# Patient Record
Sex: Female | Born: 1995 | Hispanic: No | Marital: Single | Smoking: Never smoker
Health system: Southern US, Community
[De-identification: ages and names within clinical notes are randomized; demographics above are authoritative.]

---

## 2020-04-11 ENCOUNTER — Inpatient Hospital Stay (HOSPITAL_BASED_OUTPATIENT_CLINIC_OR_DEPARTMENT_OTHER)
Admission: EM | Admit: 2020-04-11 | Discharge: 2020-04-16 | DRG: 177 | Disposition: A | Discharge status: Home / Self Care | Payer: Medicaid - Out of State | Attending: Internal Medicine | Admitting: Internal Medicine

## 2020-04-11 ENCOUNTER — Encounter (HOSPITAL_BASED_OUTPATIENT_CLINIC_OR_DEPARTMENT_OTHER): Payer: Self-pay | Admitting: Emergency Medicine

## 2020-04-11 ENCOUNTER — Emergency Department (HOSPITAL_BASED_OUTPATIENT_CLINIC_OR_DEPARTMENT_OTHER): Payer: Medicaid - Out of State

## 2020-04-11 ENCOUNTER — Other Ambulatory Visit: Payer: Self-pay

## 2020-04-11 DIAGNOSIS — T380X5A Adverse effect of glucocorticoids and synthetic analogues, initial encounter: Secondary | ICD-10-CM | POA: Diagnosis not present

## 2020-04-11 DIAGNOSIS — Z6841 Body Mass Index (BMI) 40.0 and over, adult: Secondary | ICD-10-CM

## 2020-04-11 DIAGNOSIS — F419 Anxiety disorder, unspecified: Secondary | ICD-10-CM | POA: Diagnosis present

## 2020-04-11 DIAGNOSIS — Z791 Long term (current) use of non-steroidal anti-inflammatories (NSAID): Secondary | ICD-10-CM

## 2020-04-11 DIAGNOSIS — R739 Hyperglycemia, unspecified: Secondary | ICD-10-CM | POA: Diagnosis not present

## 2020-04-11 DIAGNOSIS — J9601 Acute respiratory failure with hypoxia: Secondary | ICD-10-CM | POA: Diagnosis present

## 2020-04-11 DIAGNOSIS — Z79899 Other long term (current) drug therapy: Secondary | ICD-10-CM

## 2020-04-11 DIAGNOSIS — U071 COVID-19: Principal | ICD-10-CM | POA: Diagnosis present

## 2020-04-11 DIAGNOSIS — J1282 Pneumonia due to coronavirus disease 2019: Secondary | ICD-10-CM | POA: Diagnosis present

## 2020-04-11 LAB — CBC WITH DIFFERENTIAL/PLATELET
Abs Immature Granulocytes: 0.06 10*3/uL (ref 0.00–0.07)
Basophils Absolute: 0 10*3/uL (ref 0.0–0.1)
Basophils Relative: 0 %
Eosinophils Absolute: 0 10*3/uL (ref 0.0–0.5)
Eosinophils Relative: 0 %
HCT: 40.8 % (ref 36.0–46.0)
Hemoglobin: 13.8 g/dL (ref 12.0–15.0)
Immature Granulocytes: 1 %
Lymphocytes Relative: 13 %
Lymphs Abs: 0.8 10*3/uL (ref 0.7–4.0)
MCH: 29.4 pg (ref 26.0–34.0)
MCHC: 33.8 g/dL (ref 30.0–36.0)
MCV: 86.8 fL (ref 80.0–100.0)
Monocytes Absolute: 0.2 10*3/uL (ref 0.1–1.0)
Monocytes Relative: 2 %
Neutro Abs: 5.4 10*3/uL (ref 1.7–7.7)
Neutrophils Relative %: 84 %
Platelets: 163 10*3/uL (ref 150–400)
RBC: 4.7 MIL/uL (ref 3.87–5.11)
RDW: 11.7 % (ref 11.5–15.5)
WBC: 6.5 10*3/uL (ref 4.0–10.5)
nRBC: 0 % (ref 0.0–0.2)

## 2020-04-11 LAB — COMPREHENSIVE METABOLIC PANEL
ALT: 30 U/L (ref 0–44)
AST: 32 U/L (ref 15–41)
Albumin: 3.5 g/dL (ref 3.5–5.0)
Alkaline Phosphatase: 72 U/L (ref 38–126)
Anion gap: 10 (ref 5–15)
BUN: 7 mg/dL (ref 6–20)
CO2: 24 mmol/L (ref 22–32)
Calcium: 8 mg/dL — ABNORMAL LOW (ref 8.9–10.3)
Chloride: 103 mmol/L (ref 98–111)
Creatinine, Ser: 0.8 mg/dL (ref 0.44–1.00)
GFR calc Af Amer: >60 mL/min (ref >60)
GFR calc non Af Amer: >60 mL/min (ref >60)
Glucose, Bld: 136 mg/dL — ABNORMAL HIGH (ref 70–99)
Potassium: 3.7 mmol/L (ref 3.5–5.1)
Sodium: 137 mmol/L (ref 135–145)
Total Bilirubin: 0.5 mg/dL (ref 0.3–1.2)
Total Protein: 7.1 g/dL (ref 6.5–8.1)

## 2020-04-11 LAB — TRIGLYCERIDES: Triglycerides: 107 mg/dL (ref <150)

## 2020-04-11 LAB — SARS CORONAVIRUS 2 BY RT PCR (HOSPITAL ORDER, PERFORMED IN ~~LOC~~ HOSPITAL LAB): SARS Coronavirus 2: POSITIVE — AB

## 2020-04-11 LAB — FIBRINOGEN: Fibrinogen: 443 mg/dL (ref 210–475)

## 2020-04-11 LAB — C-REACTIVE PROTEIN: CRP: 13.3 mg/dL — ABNORMAL HIGH (ref <1.0)

## 2020-04-11 LAB — LACTIC ACID, PLASMA: Lactic Acid, Venous: 1 mmol/L (ref 0.5–1.9)

## 2020-04-11 LAB — LACTATE DEHYDROGENASE: LDH: 374 U/L — ABNORMAL HIGH (ref 98–192)

## 2020-04-11 LAB — PROCALCITONIN: Procalcitonin: <0.1 ng/mL

## 2020-04-11 LAB — PREGNANCY, URINE: Preg Test, Ur: NEGATIVE

## 2020-04-11 LAB — FERRITIN: Ferritin: 318 ng/mL — ABNORMAL HIGH (ref 11–307)

## 2020-04-11 LAB — D-DIMER, QUANTITATIVE: D-Dimer, Quant: 0.68 ug/mL-FEU — ABNORMAL HIGH (ref 0.00–0.50)

## 2020-04-11 MED ORDER — METHYLPREDNISOLONE SODIUM SUCC 125 MG IJ SOLR
0.5000 mg/kg | Freq: Two times a day (BID) | INTRAMUSCULAR | Status: DC
  Administered 2020-04-11 – 2020-04-12 (×4): 53.125 mg via INTRAVENOUS
  Filled 2020-04-11 (×4): qty 2

## 2020-04-11 MED ORDER — SODIUM CHLORIDE 0.9 % IV SOLN
INTRAVENOUS | Status: DC | PRN
  Administered 2020-04-11: 500 mL via INTRAVENOUS

## 2020-04-11 MED ORDER — IOHEXOL 350 MG/ML SOLN
100.0000 mL | Freq: Once | INTRAVENOUS | Status: AC | PRN
  Administered 2020-04-11: 100 mL via INTRAVENOUS

## 2020-04-11 MED ORDER — SODIUM CHLORIDE 0.9 % IV SOLN
100.0000 mg | INTRAVENOUS | Status: AC
  Administered 2020-04-11 (×2): 100 mg via INTRAVENOUS
  Filled 2020-04-11 (×2): qty 20

## 2020-04-11 MED ORDER — KETOROLAC TROMETHAMINE 15 MG/ML IJ SOLN
15.0000 mg | Freq: Once | INTRAMUSCULAR | Status: AC
  Administered 2020-04-11: 15 mg via INTRAVENOUS
  Filled 2020-04-11: qty 1

## 2020-04-11 MED ORDER — DEXAMETHASONE SODIUM PHOSPHATE 10 MG/ML IJ SOLN: 6.0000 mg | Freq: Once | INTRAMUSCULAR | Status: DC

## 2020-04-11 MED ORDER — SODIUM CHLORIDE 0.9 % IV SOLN
100.0000 mg | Freq: Every day | INTRAVENOUS | Status: AC
  Administered 2020-04-12 – 2020-04-15 (×4): 100 mg via INTRAVENOUS
  Filled 2020-04-11 (×4): qty 20

## 2020-04-11 MED ORDER — IBUPROFEN 400 MG PO TABS
600.0000 mg | ORAL_TABLET | Freq: Once | ORAL | Status: AC
  Administered 2020-04-11: 600 mg via ORAL
  Filled 2020-04-11: qty 1

## 2020-04-11 NOTE — ED Triage Notes (Signed)
Dx with COVID 2 days ago. C/o worsening SOB O2 86% RA

## 2020-04-11 NOTE — ED Notes (Signed)
Repeat EKG per EDP Myrtis Ser

## 2020-04-11 NOTE — ED Notes (Signed)
Patient oxygen increased to 6L via [place]. Patient oxygen saturations 87-89% Patient on monitor and will continue to monitor and make changes as necessary. Patient tolerating well.

## 2020-04-11 NOTE — ED Provider Notes (Signed)
[facility] EMERGENCY DEPARTMENT Provider Note   CSN: [id number] Arrival date & time: 04/11/20  0300     History Chief Complaint  Patient presents with  . Shortness of Breath    COVID+    Kylie Petersen is a 24 y.o. female with no pertinent past medical history that presents to the emergency department today for shortness of breath secondary to Covid.  Patient states that she was diagnosed at [facility] 2 days ago, states that she started feeling worse for the past 2 days with shortness of breath, chest pain, myalgias, arthralgias, fatigue, fever, cough, abdominal pain, nausea, vomiting, diarrhea.  States that she finds it extremely difficult to breathe, especially upon exertion.  Denies any Covid contacts.  Has not gotten her Covid vaccines.  States that she was in  normal health before this.  States that she has not been eating and drinking normally due to no appetite.  States that she has been febrile at home, has been taking Tylenol and ibuprofen which has been providing some relief.  Has never been diagnosed with Covid before.  HPI     History reviewed. No pertinent past medical history.  Patient Active Problem List   Diagnosis Date Noted  . Acute hypoxemic respiratory failure due to COVID-19 [facility]) 04/11/2020    History reviewed. No pertinent surgical history.   OB History   No obstetric history on file.     No family history on file.  Social History   Tobacco Use  . Smoking status: Never Smoker  . Smokeless tobacco: Never Used  Substance Use Topics  . Alcohol use: Not on file  . Drug use: Not on file    Home Medications Prior to Admission medications   Not on File    Allergies    Patient has no known allergies.  Review of Systems   Review of Systems  Constitutional: Positive for diaphoresis, fatigue and fever. Negative for chills.  HENT: Negative for congestion, sore throat and trouble swallowing.   Eyes: Negative for pain and visual disturbance.   Respiratory: Positive for cough and shortness of breath. Negative for wheezing.   Cardiovascular: Negative for chest pain, palpitations and leg swelling.  Gastrointestinal: Positive for abdominal pain, diarrhea, nausea and vomiting. Negative for abdominal distention.  Genitourinary: Negative for difficulty urinating.  Musculoskeletal: Positive for arthralgias and myalgias. Negative for back pain, neck pain and neck stiffness.  Skin: Negative for pallor.  Neurological: Positive for headaches. Negative for dizziness, speech difficulty and weakness.  Psychiatric/Behavioral: Negative for confusion.    Physical Exam Updated Vital Signs BP 97/65 (BP Location: Left Arm)   Pulse 93   Temp 99.7 F (37.6 C) (Oral)   Resp 18   Ht 5\' 4"  (1.626 m)   Wt 106.6 kg   LMP 03/24/2020   SpO2 90%   BMI 40.34 kg/m   Physical Exam Constitutional:      General: She is in acute distress.     Appearance: Normal appearance. She is ill-appearing. She is not toxic-appearing or diaphoretic.     Comments: Patient is in respiratory distress, using accessory muscles.  Patient is able to speak to me in full sentences.  On 5 L and satting at 90%.  When patient starts to stand or walk she desats to 80%.  HENT:     Mouth/Throat:     Mouth: Mucous membranes are moist.     Pharynx: Oropharynx is clear.  Eyes:     General: No scleral icterus.  Extraocular Movements: Extraocular movements intact.     Pupils: Pupils are equal, round, and reactive to light.  Cardiovascular:     Rate and Rhythm: Regular rhythm. Tachycardia present.     Pulses: Normal pulses.     Heart sounds: Normal heart sounds.  Pulmonary:     Effort: Tachypnea, accessory muscle usage and respiratory distress present.     Breath sounds: No stridor. Rhonchi and rales present. No wheezing.  Chest:     Chest wall: No tenderness.  Abdominal:     General: Abdomen is flat. There is no distension.     Palpations: Abdomen is soft.     Tenderness:  There is no abdominal tenderness. There is no guarding or rebound.  Musculoskeletal:        General: No swelling or tenderness. Normal range of motion.     Cervical back: Normal range of motion and neck supple. No rigidity.     Right lower leg: No edema.     Left lower leg: No edema.  Skin:    General: Skin is warm and dry.     Capillary Refill: Capillary refill takes less than 2 seconds.     Coloration: Skin is not pale.  Neurological:     General: No focal deficit present.     Mental Status: She is alert and oriented to person, place, and time.     Cranial Nerves: No cranial nerve deficit.     Sensory: No sensory deficit.     Motor: No weakness.     Gait: Gait normal.  Psychiatric:        Mood and Affect: Mood normal.        Behavior: Behavior normal.     ED Results / Procedures / Treatments   Labs (all labs ordered are listed, but only abnormal results are displayed) Labs Reviewed  SARS CORONAVIRUS 2 BY RT PCR (HOSPITAL ORDER, PERFORMED IN [facility]) - Abnormal; Notable for the following components:      Result Value   SARS Coronavirus 2 POSITIVE (*)    All other components within normal limits  COMPREHENSIVE METABOLIC PANEL - Abnormal; Notable for the following components:   Glucose, Bld 136 (*)    Calcium 8.0 (*)    All other components within normal limits  D-DIMER, QUANTITATIVE (NOT [facility]) - Abnormal; Notable for the following components:   D-Dimer, Quant 0.68 (*)    All other components within normal limits  CULTURE, BLOOD (ROUTINE X 2)  CULTURE, BLOOD (ROUTINE X 2)  LACTIC ACID, PLASMA  CBC WITH DIFFERENTIAL/PLATELET  PREGNANCY, URINE  LACTIC ACID, PLASMA  PROCALCITONIN  LACTATE DEHYDROGENASE  FERRITIN  TRIGLYCERIDES  FIBRINOGEN  C-REACTIVE PROTEIN    EKG EKG Interpretation  Date/Time:  Saturday April 11 2020 10:37:55 EDT Ventricular Rate:  103 PR Interval:    QRS Duration: 87 QT Interval:  328 QTC Calculation: 430 R  Axis:   60 Text Interpretation: Sinus tachycardia Low voltage, precordial leads Confirmed by Virgina NorfolkAdam, [place] [phone]([id number]) on 04/11/2020 10:38:54 AM   Radiology DG Chest Port 1 View  Result Date: 04/11/2020 CLINICAL DATA:  COVID infection with increasing shortness of breath EXAM: PORTABLE CHEST 1 VIEW COMPARISON:  None FINDINGS: Trachea is midline. Cardiomediastinal contours partially obscured by bilateral basilar predominant airspace disease. Hilar structures also with partial obscuration. Lung volumes are low. RIGHT and LEFT hemidiaphragmatic contours are partially obscured more so on the RIGHT than the LEFT. Airspace pattern is basilar and peripheral predominant. No acute  skeletal process to the extent evaluated. IMPRESSION: 1. Bilateral basilar and predominant airspace disease in this patient with history of COVID infection. Pattern could be seen in the setting of COVID-19 infection. 2. Low volume chest. Electronically Signed   By: Donzetta Kohut M.D.   On: 04/11/2020 11:37    Procedures .Critical Care Performed by: Farrel Gordon, PA-C Authorized by: Farrel Gordon, PA-C   Critical care provider statement:    Critical care time (minutes):  25   Critical care was necessary to treat or prevent imminent or life-threatening deterioration of the following conditions:  Respiratory failure   Critical care was time spent personally by me on the following activities:  Discussions with consultants, evaluation of patient's response to treatment, examination of patient, ordering and performing treatments and interventions, ordering and review of laboratory studies, ordering and review of radiographic studies, pulse oximetry, re-evaluation of patient's condition, obtaining history from patient or surrogate and review of old charts Comments:     Covid   (including critical care time)  Medications Ordered in ED Medications  remdesivir 100 mg in sodium chloride 0.9 % 100 mL IVPB (100 mg Intravenous New  Bag/Given 04/11/20 1212)  remdesivir 100 mg in sodium chloride 0.9 % 100 mL IVPB (has no administration in time range)  methylPREDNISolone sodium succinate (SOLU-MEDROL) 125 mg/2 mL injection 53.125 mg (53.125 mg Intravenous Given 04/11/20 1213)  0.9 %  sodium chloride infusion (500 mLs Intravenous New Bag/Given 04/11/20 1211)    ED Course  I have reviewed the triage vital signs and the nursing notes.  Pertinent labs & imaging results that were available during my care of the patient were reviewed by me and considered in my medical decision making (see chart for details).    MDM Rules/Calculators/A&P                         Chantil Bari is a 24 y.o. female with no pertinent past medical history that presents to the emergency department today for shortness of breath secondary to Covid.  Patient is in respiratory distress, requiring 5 L of oxygen and satting at 90%.  When patient walks with 5 L of oxygen she desats to about 83%.  Is tachypneic and tachycardic.  Blood pressure is 105/61 currently.  Is able to talk to me in full sentences, handling secretions well.  Will need to be admitted due to respiratory distress.  Remdesivir and Solumedrol given at this time.   EKG interpreted by me with sinus tachycardia.  CBC without any leukocytopenia, CMP without any abnormalities.  Chest x-ray shows Covid pneumonia. No other labs back yet.  1200 spoke to Dr. Alanda Slim, hospitalist who accept the patient.  The patient appears reasonably stabilized for admission considering the current resources, flow, and capabilities available in the ED at this time, and I doubt any other [facility] requiring further screening and/or treatment in the ED prior to admission.   I discussed this case with my attending physician, Dr. Lockie Mola, who cosigned this note including patient's presenting symptoms, physical exam, and planned diagnostics and interventions. Attending physician stated agreement with plan or made changes to plan  which were implemented.   Attending physician assessed patient at bedside.  Final Clinical Impression(s) / ED Diagnoses Final diagnoses:  Acute respiratory failure with hypoxia (HCC)  COVID-19    Rx / DC Orders ED Discharge Orders    None       Farrel Gordon, PA-C 04/11/20 1300  Virgina Norfolk, DO 04/11/20 1408

## 2020-04-11 NOTE — ED Notes (Signed)
Pt c/o increases CP, will update EDP

## 2020-04-11 NOTE — ED Notes (Signed)
ED Provider at bedside. 

## 2020-04-11 NOTE — ED Provider Notes (Signed)
Medical screening examination/treatment/procedure(s) were conducted as a shared visit with non-physician practitioner(s) and myself.  I personally evaluated the patient during the encounter. Briefly, the patient is a 24 y.o. female with no significant medical history presents to the ED with shortness of breath.  Patient with Covid symptoms for the last 4 days.  Tested positive at outside hospital.  Arrives hypoxic.  Tachycardic.  Patient quickly desaturates to the 80s upon ambulation.  Was placed on 5 L of oxygen with improvement of oxygen.  Patient has no other significant medical history.  She is overweight however otherwise she is a healthy 24 year old.  We will get Covid admission labs including chest x-ray.  Will give IV Decadron and IV remdesivir and anticipate admission to medicine.  We will continue to monitor her respiratory process but she overall looks comfortable on oxygen.  This chart was dictated using voice recognition software.  Despite best efforts to proofread,  errors can occur which can change the documentation meaning.  Kylie Petersen was evaluated in Emergency Department on 04/11/2020 for the symptoms described in the history of present illness. She was evaluated in the context of the global COVID-19 pandemic, which necessitated consideration that the patient might be at risk for infection with the SARS-CoV-2 virus that causes COVID-19. Institutional protocols and algorithms that pertain to the evaluation of patients at risk for COVID-19 are in a state of rapid change based on information released by regulatory bodies including the CDC and federal and state organizations. These policies and algorithms were followed during the patient's care in the ED.    EKG Interpretation None           Virgina Norfolk, DO 04/11/20 1031

## 2020-04-11 NOTE — ED Notes (Signed)
Placed patient on HFNC @ 12L 100% oxygen . Patient oxygen saturations 87%, RR 35. Patient extremely anxious shaking legs. Will continue to monitor and make changes as necessary. Patient tolerating well.

## 2020-04-11 NOTE — ED Notes (Signed)
Pt eating

## 2020-04-11 NOTE — ED Notes (Signed)
Pt given ginger ale.

## 2020-04-12 DIAGNOSIS — R0602 Shortness of breath: Secondary | ICD-10-CM | POA: Diagnosis present

## 2020-04-12 DIAGNOSIS — R7989 Other specified abnormal findings of blood chemistry: Secondary | ICD-10-CM | POA: Diagnosis not present

## 2020-04-12 DIAGNOSIS — R000 Tachycardia, unspecified: Secondary | ICD-10-CM | POA: Diagnosis not present

## 2020-04-12 DIAGNOSIS — U071 COVID-19: Secondary | ICD-10-CM | POA: Diagnosis present

## 2020-04-12 DIAGNOSIS — Z6841 Body Mass Index (BMI) 40.0 and over, adult: Secondary | ICD-10-CM | POA: Diagnosis not present

## 2020-04-12 DIAGNOSIS — Z791 Long term (current) use of non-steroidal anti-inflammatories (NSAID): Secondary | ICD-10-CM | POA: Diagnosis not present

## 2020-04-12 DIAGNOSIS — Z79899 Other long term (current) drug therapy: Secondary | ICD-10-CM | POA: Diagnosis not present

## 2020-04-12 DIAGNOSIS — R739 Hyperglycemia, unspecified: Secondary | ICD-10-CM | POA: Diagnosis not present

## 2020-04-12 DIAGNOSIS — F419 Anxiety disorder, unspecified: Secondary | ICD-10-CM | POA: Diagnosis present

## 2020-04-12 DIAGNOSIS — J9601 Acute respiratory failure with hypoxia: Secondary | ICD-10-CM | POA: Diagnosis present

## 2020-04-12 DIAGNOSIS — J1282 Pneumonia due to coronavirus disease 2019: Secondary | ICD-10-CM | POA: Diagnosis present

## 2020-04-12 DIAGNOSIS — T380X5A Adverse effect of glucocorticoids and synthetic analogues, initial encounter: Secondary | ICD-10-CM | POA: Diagnosis not present

## 2020-04-12 LAB — TROPONIN I (HIGH SENSITIVITY)
Troponin I (High Sensitivity): 2 ng/L
Troponin I (High Sensitivity): 2 ng/L (ref <18)

## 2020-04-12 LAB — COMPREHENSIVE METABOLIC PANEL
ALT: 26 U/L (ref 0–44)
AST: 32 U/L (ref 15–41)
Albumin: 3.3 g/dL — ABNORMAL LOW (ref 3.5–5.0)
Alkaline Phosphatase: 64 U/L (ref 38–126)
Anion gap: 14 (ref 5–15)
BUN: 11 mg/dL (ref 6–20)
CO2: 25 mmol/L (ref 22–32)
Calcium: 8.6 mg/dL — ABNORMAL LOW (ref 8.9–10.3)
Chloride: 101 mmol/L (ref 98–111)
Creatinine, Ser: 0.85 mg/dL (ref 0.44–1.00)
GFR calc Af Amer: >60 mL/min (ref >60)
GFR calc non Af Amer: >60 mL/min (ref >60)
Glucose, Bld: 131 mg/dL — ABNORMAL HIGH (ref 70–99)
Potassium: 3.8 mmol/L (ref 3.5–5.1)
Sodium: 140 mmol/L (ref 135–145)
Total Bilirubin: 0.7 mg/dL (ref 0.3–1.2)
Total Protein: 7.1 g/dL (ref 6.5–8.1)

## 2020-04-12 LAB — CBC WITH DIFFERENTIAL/PLATELET
Abs Immature Granulocytes: 0.07 10*3/uL (ref 0.00–0.07)
Basophils Absolute: 0 10*3/uL (ref 0.0–0.1)
Basophils Relative: 0 %
Eosinophils Absolute: 0 10*3/uL (ref 0.0–0.5)
Eosinophils Relative: 0 %
HCT: 39.2 % (ref 36.0–46.0)
Hemoglobin: 13.3 g/dL (ref 12.0–15.0)
Immature Granulocytes: 1 %
Lymphocytes Relative: 11 %
Lymphs Abs: 0.8 10*3/uL (ref 0.7–4.0)
MCH: 30 pg (ref 26.0–34.0)
MCHC: 33.9 g/dL (ref 30.0–36.0)
MCV: 88.3 fL (ref 80.0–100.0)
Monocytes Absolute: 0.4 10*3/uL (ref 0.1–1.0)
Monocytes Relative: 6 %
Neutro Abs: 6.3 10*3/uL (ref 1.7–7.7)
Neutrophils Relative %: 82 %
Platelets: 205 10*3/uL (ref 150–400)
RBC: 4.44 MIL/uL (ref 3.87–5.11)
RDW: 11.9 % (ref 11.5–15.5)
WBC: 7.6 10*3/uL (ref 4.0–10.5)
nRBC: 0 % (ref 0.0–0.2)

## 2020-04-12 LAB — HIV ANTIBODY (ROUTINE TESTING W REFLEX): HIV Screen 4th Generation wRfx: NONREACTIVE

## 2020-04-12 LAB — LACTATE DEHYDROGENASE: LDH: 412 U/L — ABNORMAL HIGH (ref 98–192)

## 2020-04-12 LAB — C-REACTIVE PROTEIN: CRP: 17.8 mg/dL — ABNORMAL HIGH

## 2020-04-12 LAB — ABO/RH: ABO/RH(D): O POS

## 2020-04-12 LAB — D-DIMER, QUANTITATIVE: D-Dimer, Quant: 0.93 ug/mL-FEU — ABNORMAL HIGH (ref 0.00–0.50)

## 2020-04-12 LAB — BRAIN NATRIURETIC PEPTIDE: B Natriuretic Peptide: 18.8 pg/mL (ref 0.0–100.0)

## 2020-04-12 LAB — FERRITIN: Ferritin: 391 ng/mL — ABNORMAL HIGH (ref 11–307)

## 2020-04-12 LAB — PROCALCITONIN: Procalcitonin: <0.1 ng/mL

## 2020-04-12 MED ORDER — HYDROCOD POLST-CPM POLST ER 10-8 MG/5ML PO SUER
5.0000 mL | Freq: Two times a day (BID) | ORAL | Status: DC | PRN
  Administered 2020-04-13 – 2020-04-15 (×6): 5 mL via ORAL
  Filled 2020-04-12 (×6): qty 5

## 2020-04-12 MED ORDER — LIP MEDEX EX OINT
TOPICAL_OINTMENT | CUTANEOUS | Status: AC
  Filled 2020-04-12: qty 7

## 2020-04-12 MED ORDER — POLYETHYLENE GLYCOL 3350 17 G PO PACK: 17.0000 g | PACK | Freq: Every day | ORAL | Status: DC | PRN

## 2020-04-12 MED ORDER — ACETAMINOPHEN 325 MG PO TABS
650.0000 mg | ORAL_TABLET | Freq: Four times a day (QID) | ORAL | Status: DC | PRN
  Administered 2020-04-12 – 2020-04-16 (×4): 650 mg via ORAL
  Filled 2020-04-12 (×4): qty 2

## 2020-04-12 MED ORDER — BARICITINIB 2 MG PO TABS
4.0000 mg | ORAL_TABLET | Freq: Every day | ORAL | Status: DC
  Administered 2020-04-12 – 2020-04-16 (×5): 4 mg via ORAL
  Filled 2020-04-12 (×5): qty 2

## 2020-04-12 MED ORDER — ENOXAPARIN SODIUM 60 MG/0.6ML ~~LOC~~ SOLN
50.0000 mg | SUBCUTANEOUS | Status: DC
  Administered 2020-04-12: 50 mg via SUBCUTANEOUS
  Filled 2020-04-12: qty 0.6

## 2020-04-12 MED ORDER — ZINC SULFATE 220 (50 ZN) MG PO CAPS
220.0000 mg | ORAL_CAPSULE | Freq: Every day | ORAL | Status: DC
  Administered 2020-04-12 – 2020-04-16 (×5): 220 mg via ORAL
  Filled 2020-04-12 (×5): qty 1

## 2020-04-12 MED ORDER — LIP MEDEX EX OINT: TOPICAL_OINTMENT | CUTANEOUS | Status: DC | PRN

## 2020-04-12 MED ORDER — ONDANSETRON HCL 4 MG/2ML IJ SOLN: 4.0000 mg | Freq: Four times a day (QID) | INTRAMUSCULAR | Status: DC | PRN

## 2020-04-12 MED ORDER — OXYCODONE HCL 5 MG PO TABS: 5.0000 mg | ORAL_TABLET | ORAL | Status: DC | PRN

## 2020-04-12 MED ORDER — PANTOPRAZOLE SODIUM 40 MG PO TBEC
40.0000 mg | DELAYED_RELEASE_TABLET | Freq: Every day | ORAL | Status: DC
  Administered 2020-04-12 – 2020-04-16 (×5): 40 mg via ORAL
  Filled 2020-04-12 (×5): qty 1

## 2020-04-12 MED ORDER — GUAIFENESIN-DM 100-10 MG/5ML PO SYRP
10.0000 mL | ORAL_SOLUTION | ORAL | Status: DC | PRN
  Administered 2020-04-13: 10 mL via ORAL
  Filled 2020-04-12: qty 10

## 2020-04-12 MED ORDER — ASCORBIC ACID 500 MG PO TABS
500.0000 mg | ORAL_TABLET | Freq: Every day | ORAL | Status: DC
  Administered 2020-04-12 – 2020-04-16 (×5): 500 mg via ORAL
  Filled 2020-04-12 (×5): qty 1

## 2020-04-12 MED ORDER — ZOLPIDEM TARTRATE 5 MG PO TABS
5.0000 mg | ORAL_TABLET | Freq: Every evening | ORAL | Status: DC | PRN
  Administered 2020-04-15: 5 mg via ORAL
  Filled 2020-04-12: qty 1

## 2020-04-12 MED ORDER — ONDANSETRON HCL 4 MG PO TABS: 4.0000 mg | ORAL_TABLET | Freq: Four times a day (QID) | ORAL | Status: DC | PRN

## 2020-04-12 MED ORDER — BISACODYL 5 MG PO TBEC: 5.0000 mg | DELAYED_RELEASE_TABLET | Freq: Every day | ORAL | Status: DC | PRN

## 2020-04-12 MED ORDER — MAGNESIUM CITRATE PO SOLN: 1.0000 | Freq: Once | ORAL | Status: DC | PRN

## 2020-04-12 MED ORDER — IPRATROPIUM-ALBUTEROL 20-100 MCG/ACT IN AERS
1.0000 | INHALATION_SPRAY | Freq: Four times a day (QID) | RESPIRATORY_TRACT | Status: DC
  Administered 2020-04-12 – 2020-04-16 (×17): 1 via RESPIRATORY_TRACT
  Filled 2020-04-12: qty 4

## 2020-04-12 NOTE — ED Notes (Signed)
Patient oxygen saturations continued to decrease with RR increasing.Increased flow to 15L 100 oxygen, also educated patient on a flutter valve instructing her to use every hour w/a. Patient on monitor and will continue to monitor and make necessary changes as needed.Patient tolerating well.

## 2020-04-12 NOTE — ED Notes (Signed)
Pt placed on 15L HFNC and non-rebreather per RT; EDP Palumbo made aware

## 2020-04-12 NOTE — Progress Notes (Signed)
PT states she is breathing fine at this time- no respiratory distress noted at this time. PT continues to speak in complete sentences and remains alert and oriented x 4. Encouraged PT to prone- tolerated well previously. RN aware.

## 2020-04-12 NOTE — ED Notes (Signed)
Pt placed in prone position on 15L HFNC per RT; pt tolerating well; EDP Palumbo made aware

## 2020-04-12 NOTE — H&P (Signed)
History and Physical    Kylie Petersen KVQ:[id number] DOB: Feb 10, 1996 DOA: 04/11/2020  PCP: Patient, No Pcp Per Patient coming from: Home.  Chief Complaint: Shortness of breath  HPI: Kylie Petersen is a 24 y.o. female with history of morbid obesity presented to [facility] ED with shortness of breath and cough for about 5 days.  Reportedly presented to [facility] hospital 2 days prior with the above complaints and tested positive for COVID-19 and discharged home from ED.  I was not able to find this in the care everywhere.  She had progressive dyspnea and cough with exertion that prompted her to come to ED.  Shortness of breath worse with exertion.  She admits mild headache, fatigue and fever.  She denies nausea, vomiting, abdominal pain, myalgia, loss of taste or smell.  She denies UTI symptoms or focal neuro symptoms.  She is unvaccinated against COVID-19.  She denies history of heart disease, lung disease, kidney disease or diabetes.  Patient denies smoking cigarettes, drinking alcohol recreational drug use.  In ED, desaturated to 87% on RA.  Initially required 4 to 5 L but desaturated to 80s again.  Started on 50 L by HFNC and continues to desaturate to mid and upper 80s although she seems to be very comfortable and does not seem to have respiratory distress.  She was put on NRB with HFNC and recovered to 95%.  HR as high as 130s.  Respiratory rate as high as 30s.  Mild temp to 100s.  CXR with bilateral infiltrates.  Inflammatory markers elevated.  Started on IV Solu-Medrol, remdesivir and breathing treatments.  Admitted for acute respiratory failure with hypoxia due to COVID-19 pneumonia.  ROS All review of system negative except for pertinent positives and negatives as history of present illness above.  PMH Morbid obesity  PSH History reviewed. No pertinent surgical history. Fam HX No family history of lung disease  Social Hx  reports that she has never smoked. She has never used smokeless  tobacco. No history on file for alcohol use and drug use.  Allergy No Known Allergies Home Meds Prior to Admission medications   Medication Sig Start Date End Date Taking? Authorizing Provider  acetaminophen (TYLENOL) 325 MG tablet Take 650 mg by mouth every 6 (six) hours as needed for mild pain or headache.   Yes [provider]  ibuprofen (ADVIL) 200 MG tablet Take 200 mg by mouth every 6 (six) hours as needed for fever or moderate pain.   Yes [provider]    Physical Exam: Vitals:   04/12/20 0730 04/12/20 0853 04/12/20 1032 04/12/20 1053  BP: 102/65 137/86  122/80  Pulse: (!) 110 (!) 122 (!) 110 (!) 108  Resp:  (!) 25 19   Temp:  100 F (37.8 C)  100 F (37.8 C)  TempSrc:  Oral  Oral  SpO2: (!) 87% (!) 83% 95% 92%  Weight:      Height:        GENERAL: No acute distress.  Appears well.  HEENT: MMM.  Vision and hearing grossly intact.  NECK: Supple.  No apparent JVD.  RESP: On HFNC/NRB.  No IWOB. Good fair aeration.  Bilateral crackles, more pronounced on the left. CVS:  RRR. Heart sounds normal.  ABD/GI/GU: Bowel sounds present. Soft. Non tender.  MSK/EXT:  Moves extremities. No apparent deformity or edema.  SKIN: no apparent skin lesion or wound NEURO: Awake, alert and oriented appropriately.  No gross deficit.  PSYCH: Calm. Normal affect.   Personally  Reviewed Radiological Exams CT Angio Chest PE W and/or Wo Contrast  Result Date: 04/11/2020 CLINICAL DATA:  COVID-19 positive, worsening shortness of breath, positive D-dimer and chest pain EXAM: CT ANGIOGRAPHY CHEST WITH CONTRAST TECHNIQUE: Multidetector CT imaging of the chest was performed using the standard protocol during bolus administration of intravenous contrast. Multiplanar CT image reconstructions and MIPs were obtained to evaluate the vascular anatomy. CONTRAST:  OMNIPAQUE IOHEXOL 350 MG/ML SOLN COMPARISON:  Radiograph 04/11/2020 FINDINGS: Cardiovascular: While there is satisfactory  opacification of the pulmonary arteries, evaluation is significantly limited by respiratory motion artifact which results in artifactual attenuation in volume averaging across the lobar and segmental pulmonary arteries. No large central filling defects are identified. Central pulmonary arteries are normal caliber. Insert aortic arch The aorta is normal caliber. No acute luminal abnormality or periaortic stranding. Normal 3 vessel branching of the aortic arch with normal appearance of the proximal great vessels. Normal heart size. No pericardial effusion. Mediastinum/Nodes: Scattered low-attenuation mediastinal and hilar nodes including a borderline enlarged 10 mm AP window lymph node (5/28) are favored to be reactive. No other enlarged or concerning adenopathy in the mediastinum, hila or axillary chains. No acute abnormality of the trachea or esophagus. Thyroid gland and thoracic inlet are unremarkable. Lungs/Pleura: There are multifocal areas of mixed consolidative and ground-glass opacity throughout both lungs. No visible pneumothorax or effusion. Evaluation for underlying pulmonary nodularity and masses limited by parenchymal disease and motion artifact. Upper Abdomen: Diffuse hepatic hypoattenuation compatible with hepatic steatosis. No acute abnormalities present in the visualized portions of the upper abdomen. Musculoskeletal: No chest wall mass or suspicious bone lesions identified. Review of the MIP images confirms the above findings. IMPRESSION: 1. No large central pulmonary emboli. Evaluation of the lobar and more distal segmental and subsegmental branches is severely limited by extensive respiratory motion artifact throughout the lungs. 2. Multifocal areas of mixed consolidative and ground-glass opacity throughout both lungs, compatible with multifocal pneumonia in the setting of COVID-19 positivity. 3. Hepatic steatosis Electronically Signed   By: Kreg Shropshire M.D.   On: 04/11/2020 22:35   DG Chest  Port 1 View  Result Date: 04/11/2020 CLINICAL DATA:  COVID infection with increasing shortness of breath EXAM: PORTABLE CHEST 1 VIEW COMPARISON:  None FINDINGS: Trachea is midline. Cardiomediastinal contours partially obscured by bilateral basilar predominant airspace disease. Hilar structures also with partial obscuration. Lung volumes are low. RIGHT and LEFT hemidiaphragmatic contours are partially obscured more so on the RIGHT than the LEFT. Airspace pattern is basilar and peripheral predominant. No acute skeletal process to the extent evaluated. IMPRESSION: 1. Bilateral basilar and predominant airspace disease in this patient with history of COVID infection. Pattern could be seen in the setting of COVID-19 infection. 2. Low volume chest. Electronically Signed   By: Donzetta Kohut M.D.   On: 04/11/2020 11:37     Personally Reviewed Labs: CBC: Recent Labs  Lab 04/11/20 1035 04/12/20 1025  WBC 6.5 7.6  NEUTROABS 5.4 6.3  HGB 13.8 13.3  HCT 40.8 39.2  MCV 86.8 88.3  PLT 163 205   Basic Metabolic Panel: Recent Labs  Lab 04/11/20 1035 04/12/20 1025  NA 137 140  K 3.7 3.8  CL 103 101  CO2 24 25  GLUCOSE 136* 131*  BUN 7 11  CREATININE 0.80 0.85  CALCIUM 8.0* 8.6*   GFR: Estimated Creatinine Clearance: 122.7 mL/min (by C-G formula based on SCr of 0.85 mg/dL). Liver Function Tests: Recent Labs  Lab 04/11/20 1035 04/12/20 1025  AST  32 32  ALT 30 26  ALKPHOS 72 64  BILITOT 0.5 0.7  PROT 7.1 7.1  ALBUMIN 3.5 3.3*   No results for input(s): LIPASE, AMYLASE in the last 168 hours. No results for input(s): AMMONIA in the last 168 hours. Coagulation Profile: No results for input(s): INR, PROTIME in the last 168 hours. Cardiac Enzymes: No results for input(s): CKTOTAL, CKMB, CKMBINDEX, TROPONINI in the last 168 hours. BNP (last 3 results) No results for input(s): PROBNP in the last 8760 hours. HbA1C: No results for input(s): HGBA1C in the last 72 hours. CBG: No results for  input(s): GLUCAP in the last 168 hours. Lipid Profile: Recent Labs    04/11/20 1035  TRIG 107   Thyroid Function Tests: No results for input(s): TSH, T4TOTAL, FREET4, T3FREE, THYROIDAB in the last 72 hours. Anemia Panel: Recent Labs    04/11/20 1035  FERRITIN 318*   Urine analysis: No results found for: COLORURINE, APPEARANCEUR, LABSPEC, PHURINE, GLUCOSEU, HGBUR, BILIRUBINUR, KETONESUR, PROTEINUR, UROBILINOGEN, NITRITE, LEUKOCYTESUR  Sepsis Labs:  Lactic acid 1.0. Procalcitonin less than 0.10.  Personally Reviewed EKG:  12-lead EKG with sinus tachycardia.  Assessment/Plan Active Problems:   Acute hypoxemic respiratory failure due to COVID-19 [facility]) Symptomatic for 5 days.  Progressive dyspnea and cough.  Now requiring 15L with HFNC and NRB to maintain appropriate saturation although she does not seem to be in respiratory distress on exam.  Inflammatory markers elevated.  Lactic acid and pro-Cal negative.  CXR with bilateral infiltrates. Recent Labs    04/11/20 1035 04/12/20 1025  DDIMER 0.68* 0.93*  FERRITIN 318*  --   LDH 374* 412*  CRP 13.3*  --   -Continue IV Solu-Medrol and remdesivir -Start Olumiant 4 mg daily-discussed about risk and benefits and off label use.  -Subcu Lovenox for VT prophylaxis -Supportive care with inhalers, IS, flutter valve, mucolytic's and antitussive -Encourage proning, OOB/PT/OT -Monitor inflammatory markers  Sinus tachycardia: likely due to the above.  EKG with mild sinus tachycardia. -Continue monitoring  Morbid obesity: BMI 40.34. -Encourage lifestyle change   DVT prophylaxis: Subcu Lovenox  Code Status: Full code Family Communication: Updated patient's mother over the phone.  Disposition Plan: Admitted to telemetry Consults called: None Admission status: Inpatient   Almon Hercules MD [facility]  If 7PM-7AM, please contact night-coverage [email or web address]  04/12/2020, 11:25 AM

## 2020-04-12 NOTE — Progress Notes (Signed)
   04/12/20 0853  Assess: MEWS Score  Temp 100 F (37.8 C)  BP 137/86  Pulse Rate (!) 122  Resp (!) 25  SpO2 (!) 83 %  O2 Device HFNC  O2 Flow Rate (L/min) 15 L/min  Assess: MEWS Score  MEWS Temp 0  MEWS Systolic 0  MEWS Pulse 2  MEWS RR 1  MEWS LOC 0  MEWS Score 3  MEWS Score Color Yellow  Assess: if the MEWS score is Yellow or Red  Were vital signs taken at a resting state? Yes  Focused Assessment No change from prior assessment (Have been yellow MEWs since in the ED)  Early Detection of Sepsis Score *See Row Information* Low  MEWS guidelines implemented *See Row Information* Yes  Treat  MEWS Interventions Consulted Respiratory Therapy  Take Vital Signs  Increase Vital Sign Frequency  Yellow: Q 2hr X 2 then Q 4hr X 2, if remains yellow, continue Q 4hrs  Escalate  MEWS: Escalate Yellow: discuss with charge nurse/RN and consider discussing with provider and RRT  Notify: Charge Nurse/RN  Name of Charge Nurse/RN Notified Estanislado Pandy, RN  Date Charge Nurse/RN Notified 04/12/20  Time Charge Nurse/RN Notified 1308  Notify: Provider  Provider Name/Title Dr. Alanda Slim  Date Provider Notified 04/12/20  Time Provider Notified [phone]  Notification Type Face-to-face  Notification Reason Other (Comment) (O2 sat)  Response See new orders  Date of Provider Response 04/12/20  Time of Provider Response [id number]  Document  Patient Outcome Other (Comment) (No distress, Continue monitoring )  Progress note created (see row info) Yes   Patient transferred from [facility] on HFNC, resp and O2 sat in the 80s and resp mid to high 20s, patient denies any resp distress, stated she feels much better, O2 sat in the low 80s on HFNC, Dr Alanda Slim aware, resp therapist consulted, encouraged patient and initiated non-rebreather and educated patient, patient stated she cannot tolerate it and that she feels good with the HFNC,  after education and encouragement patient is agreeable, sating 87-91% on HFNC and  non-rebreather, no physical distress noted. Will continue to assess patient.

## 2020-04-12 NOTE — Progress Notes (Signed)
Patient transferred from [facility] on HFNC, resp and O2 sat in the 80s and resp mid to high 20s, patient denies any resp distress, stated she feels much better, O2 sat in the low 80s on HFNC, Dr Alanda Slim aware, resp therapist consulted, encouraged patient and initiated non-rebreather and educated patient, patient stated she cannot tolerate it and that she feels good with the HFNC,  after education and encouragement patient is agreeable, sating 87-91% on HFNC and non-rebreather, no physical distress noted. Will continue to assess patient.

## 2020-04-12 NOTE — ED Notes (Signed)
Patient is on 15L HFNC and continued to decrease oxygen saturations. Patient placed on 15L NRB. Patient oxygen saturations are 89-90%, RR 30's. Will continue to monitor and access. Patient tolerating well.

## 2020-04-12 NOTE — Progress Notes (Signed)
Placed PT on 15 LPM non rebreather mask (over 15 LPM [facility] HFNC system. PT states she is comfortable with prone position and states she is not having any trouble breathing at this time. PT does express discomfort of being alone. No respiratory distress noted at this time and PT is alert and oriented at this time. RN aware.

## 2020-04-13 ENCOUNTER — Inpatient Hospital Stay (HOSPITAL_COMMUNITY): Payer: Medicaid - Out of State

## 2020-04-13 DIAGNOSIS — R7989 Other specified abnormal findings of blood chemistry: Secondary | ICD-10-CM

## 2020-04-13 LAB — D-DIMER, QUANTITATIVE: D-Dimer, Quant: 3.54 ug/mL-FEU — ABNORMAL HIGH (ref 0.00–0.50)

## 2020-04-13 LAB — MAGNESIUM: Magnesium: 2.5 mg/dL — ABNORMAL HIGH (ref 1.7–2.4)

## 2020-04-13 LAB — CBC WITH DIFFERENTIAL/PLATELET
Abs Immature Granulocytes: 0.11 10*3/uL — ABNORMAL HIGH (ref 0.00–0.07)
Basophils Absolute: 0 10*3/uL (ref 0.0–0.1)
Basophils Relative: 0 %
Eosinophils Absolute: 0 10*3/uL (ref 0.0–0.5)
Eosinophils Relative: 0 %
HCT: 40.3 % (ref 36.0–46.0)
Hemoglobin: 13.3 g/dL (ref 12.0–15.0)
Immature Granulocytes: 2 %
Lymphocytes Relative: 12 %
Lymphs Abs: 0.9 10*3/uL (ref 0.7–4.0)
MCH: 29.5 pg (ref 26.0–34.0)
MCHC: 33 g/dL (ref 30.0–36.0)
MCV: 89.4 fL (ref 80.0–100.0)
Monocytes Absolute: 0.5 10*3/uL (ref 0.1–1.0)
Monocytes Relative: 7 %
Neutro Abs: 5.6 10*3/uL (ref 1.7–7.7)
Neutrophils Relative %: 79 %
Platelets: 243 10*3/uL (ref 150–400)
RBC: 4.51 MIL/uL (ref 3.87–5.11)
RDW: 11.9 % (ref 11.5–15.5)
WBC: 7 10*3/uL (ref 4.0–10.5)
nRBC: 0 % (ref 0.0–0.2)

## 2020-04-13 LAB — COMPREHENSIVE METABOLIC PANEL
ALT: 23 U/L (ref 0–44)
AST: 36 U/L (ref 15–41)
Albumin: 3.4 g/dL — ABNORMAL LOW (ref 3.5–5.0)
Alkaline Phosphatase: 64 U/L (ref 38–126)
Anion gap: 11 (ref 5–15)
BUN: 15 mg/dL (ref 6–20)
CO2: 28 mmol/L (ref 22–32)
Calcium: 8.3 mg/dL — ABNORMAL LOW (ref 8.9–10.3)
Chloride: 101 mmol/L (ref 98–111)
Creatinine, Ser: 0.78 mg/dL (ref 0.44–1.00)
GFR calc Af Amer: >60 mL/min (ref >60)
GFR calc non Af Amer: >60 mL/min (ref >60)
Glucose, Bld: 156 mg/dL — ABNORMAL HIGH (ref 70–99)
Potassium: 4 mmol/L (ref 3.5–5.1)
Sodium: 140 mmol/L (ref 135–145)
Total Bilirubin: 0.6 mg/dL (ref 0.3–1.2)
Total Protein: 7.3 g/dL (ref 6.5–8.1)

## 2020-04-13 LAB — FERRITIN: Ferritin: 466 ng/mL — ABNORMAL HIGH (ref 11–307)

## 2020-04-13 LAB — C-REACTIVE PROTEIN: CRP: 11.1 mg/dL — ABNORMAL HIGH (ref <1.0)

## 2020-04-13 LAB — PHOSPHORUS: Phosphorus: 3.2 mg/dL (ref 2.5–4.6)

## 2020-04-13 MED ORDER — METHYLPREDNISOLONE SODIUM SUCC 125 MG IJ SOLR
80.0000 mg | Freq: Two times a day (BID) | INTRAMUSCULAR | Status: DC
  Administered 2020-04-13 – 2020-04-16 (×7): 80 mg via INTRAVENOUS
  Filled 2020-04-13 (×7): qty 2

## 2020-04-13 MED ORDER — ENOXAPARIN SODIUM 60 MG/0.6ML ~~LOC~~ SOLN
50.0000 mg | Freq: Two times a day (BID) | SUBCUTANEOUS | Status: DC
  Administered 2020-04-13 – 2020-04-16 (×7): 50 mg via SUBCUTANEOUS
  Filled 2020-04-13 (×7): qty 0.6

## 2020-04-13 MED ORDER — HYDROXYZINE HCL 25 MG PO TABS
25.0000 mg | ORAL_TABLET | Freq: Three times a day (TID) | ORAL | Status: AC | PRN
  Administered 2020-04-13 – 2020-04-14 (×2): 25 mg via ORAL
  Filled 2020-04-13 (×2): qty 1

## 2020-04-13 NOTE — Progress Notes (Signed)
Patient up to chair this morning.  Became short of breath and began coughing when transferred to chair with sats dropping to low 70s.  Patient recovered to 87% on NRB & HFNC after a few minutes.  Patient returned to bed from chair this afternoon. Patient tolerated transfer well with sats staying in upper 80s.  Patient was able to transfer back to bed without coughing.  Patient reported "feeling better" than we she transferred this morning.  Patient able to demonstrate use of incentive spirometer - up to 500.  Patient self-proned after ultrasound completed - sats 92%.  Will continue to monitor.

## 2020-04-13 NOTE — Progress Notes (Signed)
Bilateral lower extremity venous duplex has been completed. Preliminary results can be found in CV Proc through chart review.   04/13/20 1:15 PM Olen Cordial RVT

## 2020-04-13 NOTE — Progress Notes (Signed)
PROGRESS NOTE  [name]  QIH:[id number] DOB: 1996/01/08 DOA: 04/11/2020 PCP: None Brief Narrative: Kylie Petersen is a 24 y.o. female with no significant medical history who developed fever, headache, fatigue on 8/17, tested positive for covid-19 on 8/19 and developed progressive dyspnea with home pulse oximetry indicating low oxygen levels prompting ED visit 8/21. She was confirmed to be hypoxic with bilateral CXR infiltrates and elevated inflammatory markers (CRP 17.8). Subsequent CTA chest showed no PE on degraded imaging, but revealed diffuse consolidative and ground glass opacities. Hypoxia progressed, requiring 15L O2, so steroids are increased, baricitinib is started, and she remains hospitalized for acute hypoxic respiratory failure due to covid-19 pneumonia.   Assessment & Plan: Active Problems:   Acute hypoxemic respiratory failure due to COVID-19 [facility])  Acute hypoxemic respiratory failure due to covid-19 pneumonia: SARS-CoV-2 PCR positive on 8/19 confirmed by Care Everywhere.  - Continue remdesivir x5 days (8/21 - 8/25) - Continue steroids, will augment with persistently elevated CRP and diffuse infiltrates. - Continue baricitinib, started 8/22, for 14 days or until hospital discharge. UPT negative. - D-dimer has risen following negative CTA chest. Will augment lovenox dosing, check LE venous U/S. If d-dimer continues rising, in the setting of limited quality CTA chest and pleuritic chest pain, may repeat CTA.  - Encourage OOB, IS, FV, and awake proning if able - Continue airborne, contact precautions for 21 days from positive testing.  Morbid obesity: Estimated body mass index is 40.34 kg/m as calculated from the following:   Height as of this encounter: 5\' 4"  (1.626 m).   Weight as of this encounter: 106.6 kg.  DVT prophylaxis: Lovenox 0.5mg /kg q12h Code Status: Full Family Communication: None at bedside, pt to relay plan of care.  Disposition Plan:  Status is:  Inpatient  Remains inpatient appropriate because:Inpatient level of care appropriate due to severity of illness  Dispo: The patient is from: Home              Anticipated d/c is to: Home              Anticipated d/c date is: > 3 days              Patient currently is not medically stable to d/c.  Consultants:   None  Procedures:   None  Antimicrobials:  Remdesivir 8/21 - 8/25   Subjective: Shortness of breath is severe with exertion, moderate at rest. Up to chair but scared to take a deep breath because it causes chest pain when she coughs. Non productive cough. No leg swelling. She feels anxious.   Objective: Vitals:   04/12/20 1700 04/12/20 2107 04/12/20 2219 04/13/20 0544  BP: (!) 120/96 (!) 136/95  126/83  Pulse: (!) 103 94 98 80  Resp: (!) 28 (!) 28 (!) 26 (!) 21  Temp: (!) 97 F (36.1 C) (!) 97.5 F (36.4 C)  97.6 F (36.4 C)  TempSrc: Axillary Oral  Oral  SpO2:  92% 92% (!) 88%  Weight:      Height:        Intake/Output Summary (Last 24 hours) at 04/13/2020 1431 Last data filed at 04/12/2020 1437 Gross per 24 hour  Intake 340 ml  Output --  Net 340 ml   Filed Weights   04/11/20 1002  Weight: 106.6 kg    Gen: 24 y.o. female in no distress Pulm: Non-labored breathing supplemental oxygen by HFNC, diminished with crackles bilaterally.  CV: Regular rate and rhythm. No murmur, rub, or gallop. No JVD,  no pedal edema. GI: Abdomen soft, non-tender, non-distended, with normoactive bowel sounds. No organomegaly or masses felt. Ext: Warm, no deformities Skin: No rashes, lesions or ulcers Neuro: Alert and oriented. No focal neurological deficits. Psych: Judgement and insight appear normal. Mood anxious & affect appropriate.   Data Reviewed: I have personally reviewed following labs and imaging studies  CBC: Recent Labs  Lab 04/11/20 1035 04/12/20 1025 04/13/20 0312  WBC 6.5 7.6 7.0  NEUTROABS 5.4 6.3 5.6  HGB 13.8 13.3 13.3  HCT 40.8 39.2 40.3  MCV  86.8 88.3 89.4  PLT 163 205 243   Basic Metabolic Panel: Recent Labs  Lab 04/11/20 1035 04/12/20 1025 04/13/20 0312  NA 137 140 140  K 3.7 3.8 4.0  CL 103 101 101  CO2 24 25 28   GLUCOSE 136* 131* 156*  BUN 7 11 15   CREATININE 0.80 0.85 0.78  CALCIUM 8.0* 8.6* 8.3*  MG  --   --  2.5*  PHOS  --   --  3.2   GFR: Estimated Creatinine Clearance: 130.4 mL/min (by C-G formula based on SCr of 0.78 mg/dL). Liver Function Tests: Recent Labs  Lab 04/11/20 1035 04/12/20 1025 04/13/20 0312  AST 32 32 36  ALT 30 26 23   ALKPHOS 72 64 64  BILITOT 0.5 0.7 0.6  PROT 7.1 7.1 7.3  ALBUMIN 3.5 3.3* 3.4*   No results for input(s): LIPASE, AMYLASE in the last 168 hours. No results for input(s): AMMONIA in the last 168 hours. Coagulation Profile: No results for input(s): INR, PROTIME in the last 168 hours. Cardiac Enzymes: No results for input(s): CKTOTAL, CKMB, CKMBINDEX, TROPONINI in the last 168 hours. BNP (last 3 results) No results for input(s): PROBNP in the last 8760 hours. HbA1C: No results for input(s): HGBA1C in the last 72 hours. CBG: No results for input(s): GLUCAP in the last 168 hours. Lipid Profile: Recent Labs    04/11/20 1035  TRIG 107   Thyroid Function Tests: No results for input(s): TSH, T4TOTAL, FREET4, T3FREE, THYROIDAB in the last 72 hours. Anemia Panel: Recent Labs    04/12/20 1025 04/13/20 0312  FERRITIN 391* 466*   Urine analysis: No results found for: COLORURINE, APPEARANCEUR, LABSPEC, PHURINE, GLUCOSEU, HGBUR, BILIRUBINUR, KETONESUR, PROTEINUR, UROBILINOGEN, NITRITE, LEUKOCYTESUR Recent Results (from the past 240 hour(s))  SARS Coronavirus 2 by RT PCR (hospital order, performed in [facility] hospital lab) Nasopharyngeal Nasopharyngeal Swab     Status: Abnormal   Collection Time: 04/11/20 10:35 AM   Specimen: Nasopharyngeal Swab  Result Value Ref Range Status   SARS Coronavirus 2 POSITIVE (A) NEGATIVE Final    Comment: RESULT CALLED TO, READ  BACK BY AND VERIFIED WITH: JAMIE BAILEY RN @1142  04/11/2020 OLSONM (NOTE) SARS-CoV-2 target nucleic acids are DETECTED  SARS-CoV-2 RNA is generally detectable in upper respiratory specimens  during the acute phase of infection.  Positive results are indicative  of the presence of the identified virus, but do not rule out bacterial infection or co-infection with other pathogens not detected by the test.  Clinical correlation with patient history and  other diagnostic information is necessary to determine patient infection status.  The expected result is negative.  Fact Sheet for Patients:   [facility]   Fact Sheet for Healthcare Providers:   04/13/20    This test is not yet approved or cleared by the FDA and  has been authorized for detection and/or diagnosis of SARS-CoV-2 by FDA under an Emergency Use Authorization (EUA).  This EUA  will remain in effect (meaning th is test can be used) for the duration of  the COVID-19 declaration under Section 564(b)(1) of the Act, 21 U.S.C. section 360-bbb-3(b)(1), unless the authorization is terminated or revoked sooner.  Performed at [facility], [address] [place], [place] [address]   Blood Culture (routine x 2)     Status: None (Preliminary result)   Collection Time: 04/11/20 10:45 AM   Specimen: BLOOD RIGHT ARM  Result Value Ref Range Status   Specimen Description   Final    BLOOD RIGHT ARM Performed at [facility], [address] [place], [place] [address]    Special Requests   Final    BOTTLES DRAWN AEROBIC AND ANAEROBIC Blood Culture adequate volume Performed at [facility], [address] [place], [place] [address]    Culture   Final    NO GROWTH 2 DAYS Performed at [facility], [address] [place], [place] [address]    Report Status PENDING  Incomplete  Blood Culture (routine x 2)     Status:  None (Preliminary result)   Collection Time: 04/11/20 10:50 AM   Specimen: BLOOD RIGHT HAND  Result Value Ref Range Status   Specimen Description   Final    BLOOD RIGHT HAND Performed at [facility], [address] [place], [place] [address]    Special Requests   Final    BOTTLES DRAWN AEROBIC AND ANAEROBIC Blood Culture adequate volume Performed at [facility], [address] [place], [place] [address]    Culture   Final    NO GROWTH 2 DAYS Performed at [facility], [address] [place], [place] [address]    Report Status PENDING  Incomplete      Radiology Studies: CT Angio Chest PE W and/or Wo Contrast  Result Date: 04/11/2020 CLINICAL DATA:  COVID-19 positive, worsening shortness of breath, positive D-dimer and chest pain EXAM: CT ANGIOGRAPHY CHEST WITH CONTRAST TECHNIQUE: Multidetector CT imaging of the chest was performed using the standard protocol during bolus administration of intravenous contrast. Multiplanar CT image reconstructions and MIPs were obtained to evaluate the vascular anatomy. CONTRAST:  OMNIPAQUE IOHEXOL 350 MG/ML SOLN COMPARISON:  Radiograph 04/11/2020 FINDINGS: Cardiovascular: While there is satisfactory opacification of the pulmonary arteries, evaluation is significantly limited by respiratory motion artifact which results in artifactual attenuation in volume averaging across the lobar and segmental pulmonary arteries. No large central filling defects are identified. Central pulmonary arteries are normal caliber. Insert aortic arch The aorta is normal caliber. No acute luminal abnormality or periaortic stranding. Normal 3 vessel branching of the aortic arch with normal appearance of the proximal great vessels. Normal heart size. No pericardial effusion. Mediastinum/Nodes: Scattered low-attenuation mediastinal and hilar nodes including a borderline enlarged 10 mm AP window lymph node (5/28) are favored to be reactive. No other  enlarged or concerning adenopathy in the mediastinum, hila or axillary chains. No acute abnormality of the trachea or esophagus. Thyroid gland and thoracic inlet are unremarkable. Lungs/Pleura: There are multifocal areas of mixed consolidative and ground-glass opacity throughout both lungs. No visible pneumothorax or effusion. Evaluation for underlying pulmonary nodularity and masses limited by parenchymal disease and motion artifact. Upper Abdomen: Diffuse hepatic hypoattenuation compatible with hepatic steatosis. No acute abnormalities present in the visualized portions of the upper abdomen. Musculoskeletal: No chest wall mass or suspicious bone lesions identified. Review of the MIP images  confirms the above findings. IMPRESSION: 1. No large central pulmonary emboli. Evaluation of the lobar and more distal segmental and subsegmental branches is severely limited by extensive respiratory motion artifact throughout the lungs. 2. Multifocal areas of mixed consolidative and ground-glass opacity throughout both lungs, compatible with multifocal pneumonia in the setting of COVID-19 positivity. 3. Hepatic steatosis Electronically Signed   By: Kreg ShropshirePrice  DeHay M.D.   On: 04/11/2020 22:35    Scheduled Meds:  vitamin C  500 mg Oral Daily   baricitinib  4 mg Oral Daily   enoxaparin (LOVENOX) injection  50 mg Subcutaneous Q12H   Ipratropium-Albuterol  1 puff Inhalation Q6H   methylPREDNISolone (SOLU-MEDROL) injection  80 mg Intravenous Q12H   pantoprazole  40 mg Oral Daily   zinc sulfate  220 mg Oral Daily   Continuous Infusions:  sodium chloride 500 mL (04/11/20 1211)   remdesivir 100 mg in NS 100 mL 100 mg (04/13/20 0953)     LOS: 1 day   Time spent: 35 minutes.  Tyrone Nineyan B Pao Haffey, MD [facility] [email or web address] 04/13/2020, 2:31 PM

## 2020-04-14 LAB — COMPREHENSIVE METABOLIC PANEL
ALT: 28 U/L (ref 0–44)
AST: 38 U/L (ref 15–41)
Albumin: 3.4 g/dL — ABNORMAL LOW (ref 3.5–5.0)
Alkaline Phosphatase: 63 U/L (ref 38–126)
Anion gap: 13 (ref 5–15)
BUN: 18 mg/dL (ref 6–20)
CO2: 23 mmol/L (ref 22–32)
Calcium: 8.1 mg/dL — ABNORMAL LOW (ref 8.9–10.3)
Chloride: 99 mmol/L (ref 98–111)
Creatinine, Ser: 0.75 mg/dL (ref 0.44–1.00)
GFR calc Af Amer: >60 mL/min (ref >60)
GFR calc non Af Amer: >60 mL/min (ref >60)
Glucose, Bld: 159 mg/dL — ABNORMAL HIGH (ref 70–99)
Potassium: 3.9 mmol/L (ref 3.5–5.1)
Sodium: 135 mmol/L (ref 135–145)
Total Bilirubin: 0.5 mg/dL (ref 0.3–1.2)
Total Protein: 7.3 g/dL (ref 6.5–8.1)

## 2020-04-14 LAB — C-REACTIVE PROTEIN: CRP: 6.5 mg/dL — ABNORMAL HIGH (ref <1.0)

## 2020-04-14 LAB — D-DIMER, QUANTITATIVE: D-Dimer, Quant: 0.91 ug/mL-FEU — ABNORMAL HIGH (ref 0.00–0.50)

## 2020-04-14 MED ORDER — HYDROXYZINE HCL 25 MG PO TABS
25.0000 mg | ORAL_TABLET | Freq: Three times a day (TID) | ORAL | Status: DC | PRN
  Administered 2020-04-14 – 2020-04-15 (×2): 25 mg via ORAL
  Filled 2020-04-14 (×2): qty 1

## 2020-04-14 NOTE — Plan of Care (Signed)

## 2020-04-14 NOTE — Progress Notes (Signed)
Pt got OOB to use the [facility], had a persistent dry cough with SOB while sitting at the commode. Pt prefers to use the NRB 15L HFNC even at rest while in bed, 02 sat on 98%. Will cont to monitor.

## 2020-04-14 NOTE — Progress Notes (Signed)
   04/14/20 1100  Clinical Encounter Type  Visited With Patient  Visit Type Initial;Spiritual support;Psychological support  Referral From Patient  Consult/Referral To Chaplain  Spiritual Encounters  Spiritual Needs Emotional;Other (Comment) (Spiritual Care Conversation/Support)  Stress Factors  Patient Stress Factors None identified   I spoke briefly with Kylie Petersen to provide spiritual support. She did not identify any needs at this time.   Please, contact Spiritual Care for further assistance.   Chaplain Clint Bolder M.Div., [facility]

## 2020-04-14 NOTE — Progress Notes (Addendum)
PROGRESS NOTE  Kylie Petersen  JGG:[id number] DOB: 03-Dec-1995 DOA: 04/11/2020 PCP: None Brief Narrative: Kylie Petersen is a 24 y.o. female with no significant medical history who developed fever, headache, fatigue on 8/17, tested positive for covid-19 on 8/19 and developed progressive dyspnea with home pulse oximetry indicating low oxygen levels prompting ED visit 8/21. She was confirmed to be hypoxic with bilateral CXR infiltrates and elevated inflammatory markers (CRP 17.8). Subsequent CTA chest showed no PE on degraded imaging, but revealed diffuse consolidative and ground glass opacities. Hypoxia progressed, requiring 15L O2, so steroids are increased, baricitinib is started, and she remains hospitalized for acute hypoxic respiratory failure due to covid-19 pneumonia.   Assessment & Plan: Active Problems:   Acute hypoxemic respiratory failure due to COVID-19 [facility])  Acute hypoxemic respiratory failure due to covid-19 pneumonia: SARS-CoV-2 PCR positive on 8/19 confirmed by Care Everywhere.  - Wean oxygen as tolerated, still on 15L. - Continue remdesivir x5 days (8/21 - 8/25) - Continue steroids. CRP coming down - Continue baricitinib, started 8/22, for 14 days or until hospital discharge. UPT negative. - Continue lovenox 0.5mg /kg for now with no bleeding and elevated d-dimer. In this patient the risk of life-threatening bleeding is lower than the risk of life-threatening PE. LE venous U/S and CTA negative for acute DVT/PE (limited CT study quality) - Encourage OOB, IS, FV, and awake proning if able - Continue airborne, contact precautions for 21 days from positive testing.  Anxiety:  - Hydroxyzine prn  Hyperglycemia: Possibly from steroids.  - Check HbA1c in AM.   Morbid obesity: Estimated body mass index is 40.34 kg/m as calculated from the following:   Height as of this encounter: 5\' 4"  (1.626 m).   Weight as of this encounter: 106.6 kg.  DVT prophylaxis: Lovenox 0.5mg /kg  q12h Code Status: Full Family Communication: Spoke with mother by phone at bedside. Disposition Plan:  Status is: Inpatient  Remains inpatient appropriate because:Inpatient level of care appropriate due to severity of illness  Dispo: The patient is from: Home              Anticipated d/c is to: Home              Anticipated d/c date is: 3 days              Patient currently is not medically stable to d/c.  Consultants:   None  Procedures:   None  Antimicrobials:  Remdesivir 8/21 - 8/25   Subjective: Feeling less ill today than yesterday, like she can catch her breath at rest. Still severely dyspneic with any exertion. No chest pain or leg swelling or orthopnea or bleeding. Getting up to chair intermittently. Still quite anxious.  Objective: Vitals:   04/13/20 1558 04/13/20 2001 04/13/20 2100 04/14/20 0559  BP:  130/83  136/85  Pulse:  76  72  Resp:  20  20  Temp: 98.3 F (36.8 C) 98.9 F (37.2 C)    TempSrc: Other (Comment) Oral    SpO2:  96% 90% 94%  Weight:      Height:        Intake/Output Summary (Last 24 hours) at 04/14/2020 1349 Last data filed at 04/13/2020 1500 Gross per 24 hour  Intake 196.27 ml  Output --  Net 196.27 ml   Filed Weights   04/11/20 1002  Weight: 106.6 kg   Gen: WDWN young female in no distress Pulm: Nonlabored but tachypneic, pulls little on IS, NRB + HFNC. Diminished throughout with crackles diffusely  CV: Regular rate and rhythm. No murmur, rub, or gallop. No JVD, no dependent edema. GI: Abdomen soft, non-tender, non-distended, with normoactive bowel sounds.  Ext: Warm, no deformities Skin: No rashes, lesions or ulcers on visualized skin. Neuro: Alert and oriented. No focal neurological deficits. Psych: Judgement and insight appear fair. Mood anxious & affect congruent. Behavior is appropriate.    Data Reviewed: I have personally reviewed following labs and imaging studies  CBC: Recent Labs  Lab 04/11/20 1035 04/12/20 1025  04/13/20 0312  WBC 6.5 7.6 7.0  NEUTROABS 5.4 6.3 5.6  HGB 13.8 13.3 13.3  HCT 40.8 39.2 40.3  MCV 86.8 88.3 89.4  PLT 163 205 243   Basic Metabolic Panel: Recent Labs  Lab 04/11/20 1035 04/12/20 1025 04/13/20 0312 04/14/20 0458  NA 137 140 140 135  K 3.7 3.8 4.0 3.9  CL 103 101 101 99  CO2 24 25 28 23   GLUCOSE 136* 131* 156* 159*  BUN 7 11 15 18   CREATININE 0.80 0.85 0.78 0.75  CALCIUM 8.0* 8.6* 8.3* 8.1*  MG  --   --  2.5*  --   PHOS  --   --  3.2  --    GFR: Estimated Creatinine Clearance: 130.4 mL/min (by C-G formula based on SCr of 0.75 mg/dL). Liver Function Tests: Recent Labs  Lab 04/11/20 1035 04/12/20 1025 04/13/20 0312 04/14/20 0458  AST 32 32 36 38  ALT 30 26 23 28   ALKPHOS 72 64 64 63  BILITOT 0.5 0.7 0.6 0.5  PROT 7.1 7.1 7.3 7.3  ALBUMIN 3.5 3.3* 3.4* 3.4*   Anemia Panel: Recent Labs    04/12/20 1025 04/13/20 0312  FERRITIN 391* 466*   Recent Results (from the past 240 hour(s))  SARS Coronavirus 2 by RT PCR (hospital order, performed in [facility] lab) Nasopharyngeal Nasopharyngeal Swab     Status: Abnormal   Collection Time: 04/11/20 10:35 AM   Specimen: Nasopharyngeal Swab  Result Value Ref Range Status   SARS Coronavirus 2 POSITIVE (A) NEGATIVE Final    Comment: RESULT CALLED TO, READ BACK BY AND VERIFIED WITH: JAMIE BAILEY RN @1142  04/11/2020 OLSONM (NOTE) SARS-CoV-2 target nucleic acids are DETECTED  SARS-CoV-2 RNA is generally detectable in upper respiratory specimens  during the acute phase of infection.  Positive results are indicative  of the presence of the identified virus, but do not rule out bacterial infection or co-infection with other pathogens not detected by the test.  Clinical correlation with patient history and  other diagnostic information is necessary to determine patient infection status.  The expected result is negative.  Fact Sheet for Patients:   BoilerBrush.com.cyhttps://www.fda.gov/media/136312/download   Fact  Sheet for Healthcare Providers:   https://pope.com/https://www.fda.gov/media/136313/download    This test is not yet approved or cleared by the [place] FDA and  has been authorized for detection and/or diagnosis of SARS-CoV-2 by FDA under an Emergency Use Authorization (EUA).  This EUA will remain in effect (meaning th is test can be used) for the duration of  the COVID-19 declaration under Section 564(b)(1) of the Act, 21 U.S.C. section 360-bbb-3(b)(1), unless the authorization is terminated or revoked sooner.  Performed at [facility], [address] [place], [place] [id number]   Blood Culture (routine x 2)     Status: None (Preliminary result)   Collection Time: 04/11/20 10:45 AM   Specimen: BLOOD RIGHT ARM  Result Value Ref Range Status   Specimen Description   Final  BLOOD RIGHT ARM Performed at [facility], [address] [place], [place] [address]    Special Requests   Final    BOTTLES DRAWN AEROBIC AND ANAEROBIC Blood Culture adequate volume Performed at [facility], [address] [place], [place] [address]    Culture   Final    NO GROWTH 3 DAYS Performed at [facility], [address], [place], [place] [address]    Report Status PENDING  Incomplete  Blood Culture (routine x 2)     Status: None (Preliminary result)   Collection Time: 04/11/20 10:50 AM   Specimen: BLOOD RIGHT HAND  Result Value Ref Range Status   Specimen Description   Final    BLOOD RIGHT HAND Performed at [facility], [address] [place], [place] [address]    Special Requests   Final    BOTTLES DRAWN AEROBIC AND ANAEROBIC Blood Culture adequate volume Performed at [facility], [address] [place], [place] [address]    Culture   Final    NO GROWTH 3 DAYS Performed at [facility], [address] [place], [place] [address]    Report Status PENDING  Incomplete      Radiology Studies: VAS [place] LOWER EXTREMITY VENOUS  (DVT)  Result Date: 04/13/2020  Lower Venous DVT Study Indications: Elevated Ddimer.  Risk Factors: COVID 19 positive. Limitations: Poor ultrasound/tissue interface and body habitus. Comparison Study: No prior studies. Performing Technologist: Chanda Busing RVT  Examination Guidelines: A complete evaluation includes B-mode imaging, spectral Doppler, color Doppler, and power Doppler as needed of all accessible portions of each vessel. Bilateral testing is considered an integral part of a complete examination. Limited examinations for reoccurring indications may be performed as noted. The reflux portion of the exam is performed with the patient in reverse Trendelenburg.  +---------+---------------+---------+-----------+----------+--------------+ RIGHT    CompressibilityPhasicitySpontaneityPropertiesThrombus Aging +---------+---------------+---------+-----------+----------+--------------+ CFV      Full           Yes      Yes                                 +---------+---------------+---------+-----------+----------+--------------+ SFJ      Full                                                        +---------+---------------+---------+-----------+----------+--------------+ FV Prox  Full                                                        +---------+---------------+---------+-----------+----------+--------------+ FV Mid   Full                                                        +---------+---------------+---------+-----------+----------+--------------+ FV DistalFull                                                        +---------+---------------+---------+-----------+----------+--------------+  PFV      Full                                                        +---------+---------------+---------+-----------+----------+--------------+ POP      Full           Yes      Yes                                  +---------+---------------+---------+-----------+----------+--------------+ PTV      Full                                                        +---------+---------------+---------+-----------+----------+--------------+ PERO     Full                                                        +---------+---------------+---------+-----------+----------+--------------+   +---------+---------------+---------+-----------+----------+--------------+ LEFT     CompressibilityPhasicitySpontaneityPropertiesThrombus Aging +---------+---------------+---------+-----------+----------+--------------+ CFV      Full           Yes      Yes                                 +---------+---------------+---------+-----------+----------+--------------+ SFJ      Full                                                        +---------+---------------+---------+-----------+----------+--------------+ FV Prox  Full                                                        +---------+---------------+---------+-----------+----------+--------------+ FV Mid   Full                                                        +---------+---------------+---------+-----------+----------+--------------+ FV DistalFull                                                        +---------+---------------+---------+-----------+----------+--------------+ PFV      Full                                                        +---------+---------------+---------+-----------+----------+--------------+  POP      Full           Yes      Yes                                 +---------+---------------+---------+-----------+----------+--------------+ PTV      Full                                                        +---------+---------------+---------+-----------+----------+--------------+ PERO     Full                                                         +---------+---------------+---------+-----------+----------+--------------+     Summary: RIGHT: - There is no evidence of deep vein thrombosis in the lower extremity.  - No cystic structure found in the popliteal fossa.  LEFT: - There is no evidence of deep vein thrombosis in the lower extremity.  - No cystic structure found in the popliteal fossa.  *See table(s) above for measurements and observations. Electronically signed by Fabienne Bruns MD on 04/13/2020 at 5:14:50 PM.    Final     Scheduled Meds: . vitamin C  500 mg Oral Daily  . baricitinib  4 mg Oral Daily  . enoxaparin (LOVENOX) injection  50 mg Subcutaneous Q12H  . Ipratropium-Albuterol  1 puff Inhalation Q6H  . methylPREDNISolone (SOLU-MEDROL) injection  80 mg Intravenous Q12H  . pantoprazole  40 mg Oral Daily  . zinc sulfate  220 mg Oral Daily   Continuous Infusions: . sodium chloride 500 mL (04/11/20 1211)  . remdesivir 100 mg in NS 100 mL 100 mg (04/14/20 0920)     LOS: 2 days   Time spent: 35 minutes.  Tyrone Nine, MD [facility] [email or web address] 04/14/2020, 1:49 PM

## 2020-04-15 DIAGNOSIS — R739 Hyperglycemia, unspecified: Secondary | ICD-10-CM

## 2020-04-15 DIAGNOSIS — F419 Anxiety disorder, unspecified: Secondary | ICD-10-CM

## 2020-04-15 DIAGNOSIS — E66813 Obesity, class 3: Secondary | ICD-10-CM

## 2020-04-15 LAB — COMPREHENSIVE METABOLIC PANEL
ALT: 34 U/L (ref 0–44)
AST: 41 U/L (ref 15–41)
Albumin: 3.3 g/dL — ABNORMAL LOW (ref 3.5–5.0)
Alkaline Phosphatase: 61 U/L (ref 38–126)
Anion gap: 11 (ref 5–15)
BUN: 21 mg/dL — ABNORMAL HIGH (ref 6–20)
CO2: 28 mmol/L (ref 22–32)
Calcium: 8.3 mg/dL — ABNORMAL LOW (ref 8.9–10.3)
Chloride: 100 mmol/L (ref 98–111)
Creatinine, Ser: 0.77 mg/dL (ref 0.44–1.00)
GFR calc Af Amer: >60 mL/min (ref >60)
GFR calc non Af Amer: >60 mL/min (ref >60)
Glucose, Bld: 159 mg/dL — ABNORMAL HIGH (ref 70–99)
Potassium: 4 mmol/L (ref 3.5–5.1)
Sodium: 139 mmol/L (ref 135–145)
Total Bilirubin: 0.9 mg/dL (ref 0.3–1.2)
Total Protein: 6.9 g/dL (ref 6.5–8.1)

## 2020-04-15 LAB — D-DIMER, QUANTITATIVE: D-Dimer, Quant: 0.69 ug/mL-FEU — ABNORMAL HIGH (ref 0.00–0.50)

## 2020-04-15 LAB — C-REACTIVE PROTEIN: CRP: 3.4 mg/dL — ABNORMAL HIGH (ref <1.0)

## 2020-04-15 LAB — HEMOGLOBIN A1C
Hgb A1c MFr Bld: 5.5 % (ref 4.8–5.6)
Mean Plasma Glucose: 111.15 mg/dL

## 2020-04-15 MED ORDER — SALINE SPRAY 0.65 % NA SOLN
1.0000 | NASAL | Status: DC | PRN
  Filled 2020-04-15: qty 44

## 2020-04-15 NOTE — Progress Notes (Addendum)
Plan of care discussed with patient and patients mother Kylie Petersen via face time call. Patients mother was tearful and reports homelessness. Mother stated that she is unable to get into shelters due to covid status and request list of resources and or recommendations to assist. Will enter transition of care order.

## 2020-04-15 NOTE — Progress Notes (Addendum)
PROGRESS NOTE  Kylie Petersen  KDX:[id number] DOB: 04/21/1996 DOA: 04/11/2020 PCP: None Brief Narrative: Kylie Petersen is a 24 y.o. female with no significant medical history who developed fever, headache, fatigue on 8/17, tested positive for covid-19 on 8/19 and developed progressive dyspnea with home pulse oximetry indicating low oxygen levels prompting ED visit 8/21. She was confirmed to be hypoxic with bilateral CXR infiltrates and elevated inflammatory markers (CRP 17.8). Subsequent CTA chest showed no PE on degraded imaging, but revealed diffuse consolidative and ground glass opacities. Hypoxia progressed, requiring 15L O2, so steroids are increased, baricitinib is started, and she remains hospitalized for acute hypoxic respiratory failure due to covid-19 pneumonia.   Assessment & Plan: Active Problems:   Acute hypoxemic respiratory failure due to COVID-19 (HCC)   Obesity, Class III, BMI 40-49.9 (morbid obesity) (HCC)   Anxiety   Steroid-induced hyperglycemia  Acute hypoxemic respiratory failure due to covid-19 pneumonia: SARS-CoV-2 PCR positive on 8/19 confirmed by Care Everywhere.  - Wean oxygen as tolerated, still on 15L. - Continue remdesivir x5 days (8/21 - 8/25) - Continue steroids. CRP coming down - Continue baricitinib, started 8/22, for 14 days or until hospital discharge. UPT negative. - Continue lovenox 0.5mg /kg for now with no bleeding and elevated d-dimer. In this patient the risk of life-threatening bleeding is lower than the risk of life-threatening PE. LE venous U/S and CTA negative for acute DVT/PE (limited CT study quality) - Encourage OOB, IS, FV, and awake prone position - patient indicates she requires assistance to ambulate due to weakness, will have PT evaluate - Continue airborne, contact precautions for 21 days from positive testing. Recent Labs    04/13/20 0312 04/14/20 0458 04/15/20 0405  DDIMER 3.54* 0.91* 0.69*  FERRITIN 466*  --   --   CRP 11.1* 6.5*  3.4*   Anxiety:  - Hydroxyzine prn  Hyperglycemia in the setting of steroids.  Lab Results  Component Value Date   HGBA1C 5.5 04/15/2020    Morbid obesity: Estimated body mass index is 40.34 kg/m as calculated from the following:   Height as of this encounter: 5\' 4"  (1.626 m).   Weight as of this encounter: 106.6 kg.  DVT prophylaxis: Lovenox 0.5mg /kg q12h Code Status: Full Family Communication: Spoke with mother by phone at bedside. Disposition Plan:  Status is: Inpatient  Remains inpatient appropriate because:Inpatient level of care appropriate due to severity of illness  Dispo: The patient is from: Home              Anticipated d/c is to: Home              Anticipated d/c date is: 3 days              Patient currently is not medically stable to d/c.  Consultants:   None  Procedures:   None  Antimicrobials:  Remdesivir 8/21 - 8/25   Subjective: No acute issues or events overnight, patient still somewhat anxious to ambulate alone due to "coughing spells" and worsening shortness of breath with exertion.  Denies nausea, vomiting, diarrhea, constipation, headache, fevers, chills.  Objective: Vitals:   04/15/20 0448 04/15/20 0925 04/15/20 1209 04/15/20 1233  BP: 117/82 117/81  129/87  Pulse: 71 66 74 71  Resp: 19 18 20 16   Temp: 98.6 F (37 C) 98.1 F (36.7 C)  98.3 F (36.8 C)  TempSrc: Oral Oral  Oral  SpO2: 93% 95% 93% 95%  Weight:      Height:  No intake or output data in the 24 hours ending 04/15/20 1547 Filed Weights   04/11/20 1002  Weight: 106.6 kg   Gen: WDWN young female in no distress Pulm: Diminished breath sounds bilaterally without overt rales or rhonchi, minimally tachypneic but without respiratory distress or accessory muscle use CV: Regular rate and rhythm. No murmur, rub, or gallop. No JVD, no dependent edema. GI: Abdomen soft, non-tender, non-distended, with normoactive bowel sounds.  Ext: Warm, no deformities Skin: No rashes,  lesions or ulcers on visualized skin. Neuro: Alert and oriented. No focal neurological deficits. Psych: Mood anxious & affect congruent. Behavior is appropriate.    Data Reviewed: I have personally reviewed following labs and imaging studies  CBC: Recent Labs  Lab 04/11/20 1035 04/12/20 1025 04/13/20 0312  WBC 6.5 7.6 7.0  NEUTROABS 5.4 6.3 5.6  HGB 13.8 13.3 13.3  HCT 40.8 39.2 40.3  MCV 86.8 88.3 89.4  PLT 163 205 243   Basic Metabolic Panel: Recent Labs  Lab 04/11/20 1035 04/12/20 1025 04/13/20 0312 04/14/20 0458 04/15/20 0405  NA 137 140 140 135 139  K 3.7 3.8 4.0 3.9 4.0  CL 103 101 101 99 100  CO2 24 25 28 23 28   GLUCOSE 136* 131* 156* 159* 159*  BUN 7 11 15 18  21*  CREATININE 0.80 0.85 0.78 0.75 0.77  CALCIUM 8.0* 8.6* 8.3* 8.1* 8.3*  MG  --   --  2.5*  --   --   PHOS  --   --  3.2  --   --    GFR: Estimated Creatinine Clearance: 130.4 mL/min (by C-G formula based on SCr of 0.77 mg/dL). Liver Function Tests: Recent Labs  Lab 04/11/20 1035 04/12/20 1025 04/13/20 0312 04/14/20 0458 04/15/20 0405  AST 32 32 36 38 41  ALT 30 26 23 28  34  ALKPHOS 72 64 64 63 61  BILITOT 0.5 0.7 0.6 0.5 0.9  PROT 7.1 7.1 7.3 7.3 6.9  ALBUMIN 3.5 3.3* 3.4* 3.4* 3.3*   Anemia Panel: Recent Labs    04/13/20 04/17/20  FERRITIN 466*   Recent Results (from the past 240 hour(s))  SARS Coronavirus 2 by RT PCR (hospital order, performed in [facility] hospital lab) Nasopharyngeal Nasopharyngeal Swab     Status: Abnormal   Collection Time: 04/11/20 10:35 AM   Specimen: Nasopharyngeal Swab  Result Value Ref Range Status   SARS Coronavirus 2 POSITIVE (A) NEGATIVE Final    Comment: RESULT CALLED TO, READ BACK BY AND VERIFIED WITH: JAMIE BAILEY RN @1142  04/11/2020 OLSONM (NOTE) SARS-CoV-2 target nucleic acids are DETECTED  SARS-CoV-2 RNA is generally detectable in upper respiratory specimens  during the acute phase of infection.  Positive results are indicative  of the  presence of the identified virus, but do not rule out bacterial infection or co-infection with other pathogens not detected by the test.  Clinical correlation with patient history and  other diagnostic information is necessary to determine patient infection status.  The expected result is negative.  Fact Sheet for Patients:   [facility]   Fact Sheet for Healthcare Providers:   04/13/20    This test is not yet approved or cleared by the FDA and  has been authorized for detection and/or diagnosis of SARS-CoV-2 by FDA under an Emergency Use Authorization (EUA).  This EUA will remain in effect (meaning th is test can be used) for the duration of  the COVID-19 declaration under Section 564(b)(1) of the Act, 21 U.S.C. section  360-bbb-3(b)(1), unless the authorization is terminated or revoked sooner.  Performed at [facility], [address] [place], [place] [address]   Blood Culture (routine x 2)     Status: None (Preliminary result)   Collection Time: 04/11/20 10:45 AM   Specimen: BLOOD RIGHT ARM  Result Value Ref Range Status   Specimen Description   Final    BLOOD RIGHT ARM Performed at [facility], [address] [place], [place] [address]    Special Requests   Final    BOTTLES DRAWN AEROBIC AND ANAEROBIC Blood Culture adequate volume Performed at [facility], [address] [place], [place] [address]    Culture   Final    NO GROWTH 4 DAYS Performed at [facility], [address] [place], [place] [address]    Report Status PENDING  Incomplete  Blood Culture (routine x 2)     Status: None (Preliminary result)   Collection Time: 04/11/20 10:50 AM   Specimen: BLOOD RIGHT HAND  Result Value Ref Range Status   Specimen Description   Final    BLOOD RIGHT HAND Performed at [facility], [address] [place], [place] [address]    Special  Requests   Final    BOTTLES DRAWN AEROBIC AND ANAEROBIC Blood Culture adequate volume Performed at [facility], [address] [place], [place] [address]    Culture   Final    NO GROWTH 4 DAYS Performed at [facility], [address] [place], [place] [address]    Report Status PENDING  Incomplete      Radiology Studies: No results found.  Scheduled Meds: . vitamin C  500 mg Oral Daily  . baricitinib  4 mg Oral Daily  . enoxaparin (LOVENOX) injection  50 mg Subcutaneous Q12H  . Ipratropium-Albuterol  1 puff Inhalation Q6H  . methylPREDNISolone (SOLU-MEDROL) injection  80 mg Intravenous Q12H  . pantoprazole  40 mg Oral Daily  . zinc sulfate  220 mg Oral Daily   Continuous Infusions: . sodium chloride 500 mL (04/11/20 1211)     LOS: 3 days   Time spent: 35 minutes.  Azucena Fallen, DO [facility] Pager: Secure chat  04/15/2020, 3:47 PM

## 2020-04-15 NOTE — Progress Notes (Signed)
Assisted patient to side of bed, and then bedside commode without desaturation below 90%, patient did desaturate getting back to bed to 85% for approximately 30-40 seconds and then recovered well into the 90's. Patient stated she had no shortness of breath at this time.

## 2020-04-15 NOTE — Progress Notes (Addendum)
Patient sat along side of bed for 1 minutes. Initial goal was to move into chair. With standing pt became short of breath  HFNC 15 L in placedesaturation at 84%. Saturation recovered shortly after sitting at 92%. Will continue to encourage and monitor.

## 2020-04-15 NOTE — Progress Notes (Signed)
Patient tolerating proning protocol for one hour. Will continue to encourage and monitor.

## 2020-04-16 DIAGNOSIS — R739 Hyperglycemia, unspecified: Secondary | ICD-10-CM

## 2020-04-16 DIAGNOSIS — F419 Anxiety disorder, unspecified: Secondary | ICD-10-CM

## 2020-04-16 DIAGNOSIS — T380X5A Adverse effect of glucocorticoids and synthetic analogues, initial encounter: Secondary | ICD-10-CM

## 2020-04-16 LAB — CULTURE, BLOOD (ROUTINE X 2)
Culture: NO GROWTH
Culture: NO GROWTH
Special Requests: ADEQUATE
Special Requests: ADEQUATE

## 2020-04-16 LAB — COMPREHENSIVE METABOLIC PANEL
ALT: 38 U/L (ref 0–44)
AST: 29 U/L (ref 15–41)
Albumin: 3.4 g/dL — ABNORMAL LOW (ref 3.5–5.0)
Alkaline Phosphatase: 60 U/L (ref 38–126)
Anion gap: 11 (ref 5–15)
BUN: 20 mg/dL (ref 6–20)
CO2: 26 mmol/L (ref 22–32)
Calcium: 8.2 mg/dL — ABNORMAL LOW (ref 8.9–10.3)
Chloride: 99 mmol/L (ref 98–111)
Creatinine, Ser: 0.82 mg/dL (ref 0.44–1.00)
GFR calc Af Amer: >60 mL/min (ref >60)
GFR calc non Af Amer: >60 mL/min (ref >60)
Glucose, Bld: 151 mg/dL — ABNORMAL HIGH (ref 70–99)
Potassium: 3.8 mmol/L (ref 3.5–5.1)
Sodium: 136 mmol/L (ref 135–145)
Total Bilirubin: 0.8 mg/dL (ref 0.3–1.2)
Total Protein: 7 g/dL (ref 6.5–8.1)

## 2020-04-16 LAB — C-REACTIVE PROTEIN: CRP: 1.8 mg/dL — ABNORMAL HIGH (ref <1.0)

## 2020-04-16 LAB — D-DIMER, QUANTITATIVE: D-Dimer, Quant: 0.64 ug/mL-FEU — ABNORMAL HIGH (ref 0.00–0.50)

## 2020-04-16 MED ORDER — PREDNISONE 10 MG PO TABS: ORAL_TABLET | ORAL | 0 refills | Status: DC

## 2020-04-16 MED ORDER — PREDNISONE 10 MG PO TABS: ORAL_TABLET | ORAL | 0 refills | Status: AC

## 2020-04-16 NOTE — TOC Transition Note (Addendum)
Transition of Care [facility]) - CM/SW Discharge Note   Patient Details  Name: Kylie Petersen MRN: [id number] Date of Birth: 07-22-1996  Transition of Care [facility]) CM/SW Contact:  Darleene Cleaver, LCSW Phone Number: 04/16/2020, 2:51 PM   Clinical Narrative:     CSW was informed that patient is technically homeless.  CSW spoke to patient's mother who stated she had just moved here to look for a new home.  Patient then moved down to [facility] from [place] and became positive with Covid.  CSW spoke to patient's mother on the phone and informed her that CSW can make a referral to [facility].  CSW attempted to contact [facility], and had to leave a message on voice mail.  CSW was informed that patient will need oxygen upon discharge, CSW made referral to [employer] for oxygen.  Referral mad to [facility] to see if patient qualifies for assistance staying at the hotel that allows Covid patients, waiting for call back.  CSW will also provide a list of resources in [facility], which patient and family can contact.  Patient will be discharging with her mom back to the hotel they are currently staying at.   Final next level of care: Home/Self Care Barriers to Discharge: Homeless with medical needs   Patient Goals and CMS Choice Patient states their goals for this hospitalization and ongoing recovery are:: To stay in quarantine until Covid free CMS Medicare.gov Compare Post Acute Care list provided to:: Patient Represenative (must comment) Choice offered to / list presented to : Parent  Discharge Placement  Patient discharging to hotel with her mother and oxygen.        Discharge Plan and Services In-house Referral: Clinical Social Work                DME Agency: [employer] Date DME Agency Contacted: 04/16/20 Time DME Agency Contacted: [phone] Representative spoke with at DME Agency: Ian Malkin            Social Determinants of  Health (SDOH) Interventions     Readmission Risk Interventions No flowsheet data found.

## 2020-04-16 NOTE — Progress Notes (Signed)
SATURATION QUALIFICATIONS:   Patient Saturations on Room Air at Rest = 90 %  Patient Saturations on Room Air while Ambulating = 84 %  Patient Saturations on 4 Liters of oxygen while Ambulating = 92 %  Please briefly explain why patient needs home oxygen:  Patient ambulated to restroom on room air. Oxygen saturation was 84 %. Activity tolerated moderately well. Oxygen increased to 4 liters, saturation increased 92%. Pt is now up in the chair.Will continue to monitor and encourage.

## 2020-04-16 NOTE — Discharge Summary (Signed)
Physician Discharge Summary  Kylie Petersen YIF:[id number] DOB: [birth year] DOA: 04/11/2020  PCP: Patient, No Pcp Per  Admit date: 04/11/2020 Discharge date: 04/16/2020   Admitted From: Home Disposition: Home  Recommendations for Outpatient Follow-up:  1. Follow up with PCP in 1-2 weeks 2. Please obtain BMP/CBC in one week  Home Health: None Equipment/Devices: Oxygen  Discharge Condition: Stable CODE STATUS: Full Diet recommendation: Regular diet  Brief/Interim Summary: Danayah Smyre is a 24 y.o. female with no significant medical history who developed fever, headache, fatigue on 8/17, tested positive for covid-19 on 8/19 and developed progressive dyspnea with home pulse oximetry indicating low oxygen levels prompting ED visit 8/21. She was confirmed to be hypoxic with bilateral CXR infiltrates and elevated inflammatory markers (CRP 17.8). Subsequent CTA chest showed no PE on degraded imaging, but revealed diffuse consolidative and ground glass opacities. Hypoxia progressed, requiring 15L O2, so steroids are increased, baricitinib is started, and she remains hospitalized for acute hypoxic respiratory failure due to covid-19 pneumonia.  Patient met as above with acute hypoxic respiratory failure in setting of COVID-19 pneumonia.  Over the past 5 days patient has had improving symptoms has completed her Remdesivir course and was placed on baricitinib at intake with remarkably improving CRP and inflammatory markers.  At this time patient is able to ambulate without difficulty, denies any dyspnea with exertion and only mild fatigue ongoing.  She is able to ambulate today without hypoxia on 4 L nasal cannula maintaining sats above 90%.  At this time patient is otherwise stable and agreeable for discharge home, she will continue steroid taper as discussed, will need close follow-up with PCP in the next 1 to 2 weeks to further wean off oxygen and continue to increase activity as tolerated in the next  few weeks.  We discussed need for ongoing quarantine, discussed case with her mother over the phone as well.  Case management working on disposition and oxygen delivery prior to discharge.  Discharge Diagnoses:  Active Problems:   Acute hypoxemic respiratory failure due to COVID-19 (HCC)   Obesity, Class III, BMI 40-49.9 (morbid obesity) ([place])   Anxiety   Steroid-induced hyperglycemia    Discharge Instructions  Discharge Instructions    Call MD for:  difficulty breathing, headache or visual disturbances   Complete by: As directed    Call MD for:  persistant dizziness or light-headedness   Complete by: As directed    Call MD for:  temperature >100.4   Complete by: As directed    Increase activity slowly   Complete by: As directed      Allergies as of 04/16/2020   No Known Allergies     Medication List    STOP taking these medications   ibuprofen 200 MG tablet Commonly known as: ADVIL     TAKE these medications   acetaminophen 325 MG tablet Commonly known as: TYLENOL Take 650 mg by mouth every 6 (six) hours as needed for mild pain or headache.   predniSONE 10 MG tablet Commonly known as: DELTASONE Take 4 tablets (40 mg total) by mouth daily for 4 days, THEN 3 tablets (30 mg total) daily for 4 days, THEN 2 tablets (20 mg total) daily for 4 days, THEN 1 tablet (10 mg total) daily for 4 days. Start taking on: April 16, 2020            Durable Medical Equipment  (From admission, onward)         Start     Ordered  04/16/20 1436  DME Oxygen  Once       Question Answer Comment  Length of Need 6 Months   Mode or (Route) Nasal cannula   Liters per Minute 4   Frequency Continuous (stationary and portable oxygen unit needed)   Oxygen conserving device No   Oxygen delivery system Gas      04/16/20 1435          No Known Allergies  Consultations: None  Procedures/Studies: CT Angio Chest PE W and/or Wo Contrast  Result Date: 04/11/2020 CLINICAL DATA:   COVID-19 positive, worsening shortness of breath, positive D-dimer and chest pain EXAM: CT ANGIOGRAPHY CHEST WITH CONTRAST TECHNIQUE: Multidetector CT imaging of the chest was performed using the standard protocol during bolus administration of intravenous contrast. Multiplanar CT image reconstructions and MIPs were obtained to evaluate the vascular anatomy. CONTRAST:  133mL OMNIPAQUE IOHEXOL 350 MG/ML SOLN COMPARISON:  Radiograph 04/11/2020 FINDINGS: Cardiovascular: While there is satisfactory opacification of the pulmonary arteries, evaluation is significantly limited by respiratory motion artifact which results in artifactual attenuation in volume averaging across the lobar and segmental pulmonary arteries. No large central filling defects are identified. Central pulmonary arteries are normal caliber. Insert aortic arch The aorta is normal caliber. No acute luminal abnormality or periaortic stranding. Normal 3 vessel branching of the aortic arch with normal appearance of the proximal great vessels. Normal heart size. No pericardial effusion. Mediastinum/Nodes: Scattered low-attenuation mediastinal and hilar nodes including a borderline enlarged 10 mm AP window lymph node (5/28) are favored to be reactive. No other enlarged or concerning adenopathy in the mediastinum, hila or axillary chains. No acute abnormality of the trachea or esophagus. Thyroid gland and thoracic inlet are unremarkable. Lungs/Pleura: There are multifocal areas of mixed consolidative and ground-glass opacity throughout both lungs. No visible pneumothorax or effusion. Evaluation for underlying pulmonary nodularity and masses limited by parenchymal disease and motion artifact. Upper Abdomen: Diffuse hepatic hypoattenuation compatible with hepatic steatosis. No acute abnormalities present in the visualized portions of the upper abdomen. Musculoskeletal: No chest wall mass or suspicious bone lesions identified. Review of the MIP images confirms  the above findings. IMPRESSION: 1. No large central pulmonary emboli. Evaluation of the lobar and more distal segmental and subsegmental branches is severely limited by extensive respiratory motion artifact throughout the lungs. 2. Multifocal areas of mixed consolidative and ground-glass opacity throughout both lungs, compatible with multifocal pneumonia in the setting of COVID-19 positivity. 3. Hepatic steatosis Electronically Signed   By: Lovena Le M.D.   On: 04/11/2020 22:35   DG Chest Port 1 View  Result Date: 04/11/2020 CLINICAL DATA:  COVID infection with increasing shortness of breath EXAM: PORTABLE CHEST 1 VIEW COMPARISON:  None FINDINGS: Trachea is midline. Cardiomediastinal contours partially obscured by bilateral basilar predominant airspace disease. Hilar structures also with partial obscuration. Lung volumes are low. RIGHT and LEFT hemidiaphragmatic contours are partially obscured more so on the RIGHT than the LEFT. Airspace pattern is basilar and peripheral predominant. No acute skeletal process to the extent evaluated. IMPRESSION: 1. Bilateral basilar and predominant airspace disease in this patient with history of COVID infection. Pattern could be seen in the setting of COVID-19 infection. 2. Low volume chest. Electronically Signed   By: Zetta Bills M.D.   On: 04/11/2020 11:37   VAS [place] LOWER EXTREMITY VENOUS (DVT)  Result Date: 04/13/2020  Lower Venous DVT Study Indications: Elevated Ddimer.  Risk Factors: COVID 19 positive. Limitations: Poor ultrasound/tissue interface and body habitus. Comparison Study: No prior  studies. Performing Technologist: Oliver Hum RVT  Examination Guidelines: A complete evaluation includes B-mode imaging, spectral Doppler, color Doppler, and power Doppler as needed of all accessible portions of each vessel. Bilateral testing is considered an integral part of a complete examination. Limited examinations for reoccurring indications may be performed as  noted. The reflux portion of the exam is performed with the patient in reverse Trendelenburg.  +---------+---------------+---------+-----------+----------+--------------+ RIGHT    CompressibilityPhasicitySpontaneityPropertiesThrombus Aging +---------+---------------+---------+-----------+----------+--------------+ CFV      Full           Yes      Yes                                 +---------+---------------+---------+-----------+----------+--------------+ SFJ      Full                                                        +---------+---------------+---------+-----------+----------+--------------+ FV Prox  Full                                                        +---------+---------------+---------+-----------+----------+--------------+ FV Mid   Full                                                        +---------+---------------+---------+-----------+----------+--------------+ FV DistalFull                                                        +---------+---------------+---------+-----------+----------+--------------+ PFV      Full                                                        +---------+---------------+---------+-----------+----------+--------------+ POP      Full           Yes      Yes                                 +---------+---------------+---------+-----------+----------+--------------+ PTV      Full                                                        +---------+---------------+---------+-----------+----------+--------------+ PERO     Full                                                        +---------+---------------+---------+-----------+----------+--------------+   +---------+---------------+---------+-----------+----------+--------------+  LEFT     CompressibilityPhasicitySpontaneityPropertiesThrombus Aging +---------+---------------+---------+-----------+----------+--------------+ CFV      Full            Yes      Yes                                 +---------+---------------+---------+-----------+----------+--------------+ SFJ      Full                                                        +---------+---------------+---------+-----------+----------+--------------+ FV Prox  Full                                                        +---------+---------------+---------+-----------+----------+--------------+ FV Mid   Full                                                        +---------+---------------+---------+-----------+----------+--------------+ FV DistalFull                                                        +---------+---------------+---------+-----------+----------+--------------+ PFV      Full                                                        +---------+---------------+---------+-----------+----------+--------------+ POP      Full           Yes      Yes                                 +---------+---------------+---------+-----------+----------+--------------+ PTV      Full                                                        +---------+---------------+---------+-----------+----------+--------------+ PERO     Full                                                        +---------+---------------+---------+-----------+----------+--------------+     Summary: RIGHT: - There is no evidence of deep vein thrombosis in the lower extremity.  - No cystic structure found in the popliteal fossa.  LEFT: - There is no evidence of deep vein thrombosis in the lower extremity.  - No  cystic structure found in the popliteal fossa.  *See table(s) above for measurements and observations. Electronically signed by Ruta Hinds MD on 04/13/2020 at 5:14:50 PM.    Final      Subjective: No acute issues or events overnight, dyspnea markedly improving, denies nausea, vomiting, diarrhea, constipation, headache, fevers, chills.  Discharge Exam: Vitals:    04/16/20 0802 04/16/20 1312  BP:  103/72  Pulse: 97 89  Resp: 18 18  Temp:  99 F (37.2 C)  SpO2: 92% 94%   Vitals:   04/16/20 0525 04/16/20 0800 04/16/20 0802 04/16/20 1312  BP: (!) 144/93 101/68  103/72  Pulse: 97 90 97 89  Resp: $Remo'20 18 18 18  'UYvAp$ Temp: 97.9 F (36.6 C) 98.3 F (36.8 C)  99 F (37.2 C)  TempSrc: Oral Oral    SpO2: (!) 89% 94% 92% 94%  Weight:      Height:        General: Pt is alert, awake, not in acute distress Cardiovascular: RRR, S1/S2 +, no rubs, no gallops Respiratory: CTA bilaterally, no wheezing, no rhonchi Abdominal: Soft, NT, ND, bowel sounds + Extremities: no edema, no cyanosis    The results of significant diagnostics from this hospitalization (including imaging, microbiology, ancillary and laboratory) are listed below for reference.     Microbiology: Recent Results (from the past 240 hour(s))  SARS Coronavirus 2 by RT PCR (hospital order, performed in [facility] hospital lab) Nasopharyngeal Nasopharyngeal Swab     Status: Abnormal   Collection Time: 04/11/20 10:35 AM   Specimen: Nasopharyngeal Swab  Result Value Ref Range Status   SARS Coronavirus 2 POSITIVE (A) NEGATIVE Final    Comment: RESULT CALLED TO, READ BACK BY AND VERIFIED WITH: JAMIE BAILEY RN $RemoveB'@1142'[id number]$  04/11/2020 OLSONM (NOTE) SARS-CoV-2 target nucleic acids are DETECTED  SARS-CoV-2 RNA is generally detectable in upper respiratory specimens  during the acute phase of infection.  Positive results are indicative  of the presence of the identified virus, but do not rule out bacterial infection or co-infection with other pathogens not detected by the test.  Clinical correlation with patient history and  other diagnostic information is necessary to determine patient infection status.  The expected result is negative.  Fact Sheet for Patients:   StrictlyIdeas.no   Fact Sheet for Healthcare Providers:   BankingDealers.co.za    This  test is not yet approved or cleared by the [place] FDA and  has been authorized for detection and/or diagnosis of SARS-CoV-2 by FDA under an Emergency Use Authorization (EUA).  This EUA will remain in effect (meaning th is test can be used) for the duration of  the COVID-19 declaration under Section 564(b)(1) of the Act, 21 U.S.C. section 360-bbb-3(b)(1), unless the authorization is terminated or revoked sooner.  Performed at [facility], [address] [place], [place] [address]   Blood Culture (routine x 2)     Status: None   Collection Time: 04/11/20 10:45 AM   Specimen: BLOOD RIGHT ARM  Result Value Ref Range Status   Specimen Description   Final    BLOOD RIGHT ARM Performed at [facility], [address] [place], [place] [address]    Special Requests   Final    BOTTLES DRAWN AEROBIC AND ANAEROBIC Blood Culture adequate volume Performed at [facility], [address] [place], [place] [address]    Culture   Final    NO GROWTH 5 DAYS Performed at [facility], [place] [address] [place], [place] [address]    Report Status 04/16/2020 FINAL  Final  Blood Culture (routine x 2)     Status: None   Collection Time: 04/11/20 10:50 AM   Specimen: BLOOD RIGHT HAND  Result Value Ref Range Status   Specimen Description   Final    BLOOD RIGHT HAND Performed at [facility], [address] [place], [place] [address]    Special Requests   Final    BOTTLES DRAWN AEROBIC AND ANAEROBIC Blood Culture adequate volume Performed at [facility], [address] [place], [place] [address]    Culture   Final    NO GROWTH 5 DAYS Performed at [facility], [place] [address], [place],  [address]    Report Status 04/16/2020 FINAL  Final     Labs: BNP (last 3 results) Recent Labs    04/12/20 1025  BNP 13.2   Basic Metabolic Panel: Recent Labs  Lab 04/12/20 1025 04/13/20 0312 04/14/20 0458 04/15/20 0405  04/16/20 0356  NA 140 140 135 139 136  K 3.8 4.0 3.9 4.0 3.8  CL 101 101 99 100 99  CO2 $Re'25 28 23 28 26  'Mub$ GLUCOSE 131* 156* 159* 159* 151*  BUN $Re'11 15 18 'Dok$ 21* 20  CREATININE 0.85 0.78 0.75 0.77 0.82  CALCIUM 8.6* 8.3* 8.1* 8.3* 8.2*  MG  --  2.5*  --   --   --   PHOS  --  3.2  --   --   --    Liver Function Tests: Recent Labs  Lab 04/12/20 1025 04/13/20 0312 04/14/20 0458 04/15/20 0405 04/16/20 0356  AST 32 36 38 41 29  ALT $Re'26 23 28 'rbz$ 34 38  ALKPHOS 64 64 63 61 60  BILITOT 0.7 0.6 0.5 0.9 0.8  PROT 7.1 7.3 7.3 6.9 7.0  ALBUMIN 3.3* 3.4* 3.4* 3.3* 3.4*   No results for input(s): LIPASE, AMYLASE in the last 168 hours. No results for input(s): AMMONIA in the last 168 hours. CBC: Recent Labs  Lab 04/11/20 1035 04/12/20 1025 04/13/20 0312  WBC 6.5 7.6 7.0  NEUTROABS 5.4 6.3 5.6  HGB 13.8 13.3 13.3  HCT 40.8 39.2 40.3  MCV 86.8 88.3 89.4  PLT 163 205 243   Cardiac Enzymes: No results for input(s): CKTOTAL, CKMB, CKMBINDEX, TROPONINI in the last 168 hours. BNP: Invalid input(s): POCBNP CBG: No results for input(s): GLUCAP in the last 168 hours. D-Dimer Recent Labs    04/15/20 0405 04/16/20 0356  DDIMER 0.69* 0.64*   Hgb A1c Recent Labs    04/15/20 0405  HGBA1C 5.5   Lipid Profile No results for input(s): CHOL, HDL, LDLCALC, TRIG, CHOLHDL, LDLDIRECT in the last 72 hours. Thyroid function studies No results for input(s): TSH, T4TOTAL, T3FREE, THYROIDAB in the last 72 hours.  Invalid input(s): FREET3 Anemia work up No results for input(s): VITAMINB12, FOLATE, FERRITIN, TIBC, IRON, RETICCTPCT in the last 72 hours. Urinalysis No results found for: COLORURINE, APPEARANCEUR, [place], [place], [place], [place], [place], [place], PROTEINUR, UROBILINOGEN, NITRITE, LEUKOCYTESUR Sepsis Labs Invalid input(s): PROCALCITONIN,  WBC,  LACTICIDVEN Microbiology Recent Results (from the past 240 hour(s))  SARS Coronavirus 2 by RT PCR (hospital order, performed in [facility] hospital lab) Nasopharyngeal Nasopharyngeal Swab     Status: Abnormal   Collection Time: 04/11/20 10:35 AM   Specimen: Nasopharyngeal Swab  Result Value Ref Range Status   SARS Coronavirus 2 POSITIVE (A) NEGATIVE Final  Comment: RESULT CALLED TO, READ BACK BY AND VERIFIED WITH: Raynelle Bring RN $Remove'@1142'OfzzCVN$  04/11/2020 OLSONM (NOTE) SARS-CoV-2 target nucleic acids are DETECTED  SARS-CoV-2 RNA is generally detectable in upper respiratory specimens  during the acute phase of infection.  Positive results are indicative  of the presence of the identified virus, but do not rule out bacterial infection or co-infection with other pathogens not detected by the test.  Clinical correlation with patient history and  other diagnostic information is necessary to determine patient infection status.  The expected result is negative.  Fact Sheet for Patients:   StrictlyIdeas.no   Fact Sheet for Healthcare Providers:   BankingDealers.co.za    This test is not yet approved or cleared by the [place] FDA and  has been authorized for detection and/or diagnosis of SARS-CoV-2 by FDA under an Emergency Use Authorization (EUA).  This EUA will remain in effect (meaning th is test can be used) for the duration of  the COVID-19 declaration under Section 564(b)(1) of the Act, 21 U.S.C. section 360-bbb-3(b)(1), unless the authorization is terminated or revoked sooner.  Performed at [facility], [address] [place], [place] [address]   Blood Culture (routine x 2)     Status: None   Collection Time: 04/11/20 10:45 AM   Specimen: BLOOD RIGHT ARM  Result Value Ref Range Status   Specimen Description   Final    BLOOD RIGHT ARM Performed at [facility], [address] [place], [place] [address]    Special Requests   Final    BOTTLES DRAWN AEROBIC AND ANAEROBIC Blood Culture adequate volume Performed at [facility],  [address] [place], [place] [address]    Culture   Final    NO GROWTH 5 DAYS Performed at [facility], [place] [address] [place], [place] [address]    Report Status 04/16/2020 FINAL  Final  Blood Culture (routine x 2)     Status: None   Collection Time: 04/11/20 10:50 AM   Specimen: BLOOD RIGHT HAND  Result Value Ref Range Status   Specimen Description   Final    BLOOD RIGHT HAND Performed at [facility], [address] [place], [place] [address]    Special Requests   Final    BOTTLES DRAWN AEROBIC AND ANAEROBIC Blood Culture adequate volume Performed at [facility], [address] [place], [place] [address]    Culture   Final    NO GROWTH 5 DAYS Performed at [facility], [place] [address] [place], [place] [address]    Report Status 04/16/2020 FINAL  Final     Time coordinating discharge: Over 30 minutes  SIGNED:   Little Ishikawa, DO [facility] 04/16/2020, 2:39 PM Pager   If 7PM-7AM, please contact night-coverage [email or web address]

## 2020-04-16 NOTE — Progress Notes (Signed)
Patient ambulated to restroom on room air. Oxygen saturation was 84 %. Activity tolerated moderately well. Oxygen increased to 4 liters, saturation increased 92%. Pt is now up in the chair.Will continue to monitor and encourage.

## 2020-04-16 NOTE — Progress Notes (Signed)
Discharge instructions provided. Additional instruction reviewed regarding use of oxygen tanks. Pt discharged with oxygen tanks (x4). Pt remains stable at this time, alert oriented(x4) ambulatory without assistance. Pt denied questions, concerns at this time.

## 2020-04-16 NOTE — Discharge Instructions (Signed)
COVID-19 COVID-19 is a respiratory infection that is caused by a virus called severe acute respiratory syndrome coronavirus 2 (SARS-CoV-2). The disease is also known as coronavirus disease or novel coronavirus. In some people, the virus may not cause any symptoms. In others, it may cause a serious infection. The infection can get worse quickly and can lead to complications, such as:  Pneumonia, or infection of the lungs.  Acute respiratory distress syndrome or ARDS. This is a condition in which fluid build-up in the lungs prevents the lungs from filling with air and passing oxygen into the blood.  Acute respiratory failure. This is a condition in which there is not enough oxygen passing from the lungs to the body or when carbon dioxide is not passing from the lungs out of the body.  Sepsis or septic shock. This is a serious bodily reaction to an infection.  Blood clotting problems.  Secondary infections due to bacteria or fungus.  Organ failure. This is when your body's organs stop working. The virus that causes COVID-19 is contagious. This means that it can spread from person to person through droplets from coughs and sneezes (respiratory secretions). What are the causes? This illness is caused by a virus. You may catch the virus by:  Breathing in droplets from an infected person. Droplets can be spread by a person breathing, speaking, singing, coughing, or sneezing.  Touching something, like a table or a doorknob, that was exposed to the virus (contaminated) and then touching your mouth, nose, or eyes. What increases the risk? Risk for infection You are more likely to be infected with this virus if you:  Are within 6 feet (2 meters) of a person with COVID-19.  Provide care for or live with a person who is infected with COVID-19.  Spend time in crowded indoor spaces or live in shared housing. Risk for serious illness You are more likely to become seriously ill from the virus if  you:  Are 50 years of age or older. The higher your age, the more you are at risk for serious illness.  Live in a nursing home or long-term care facility.  Have cancer.  Have a long-term (chronic) disease such as: ? Chronic lung disease, including chronic obstructive pulmonary disease or asthma. ? A long-term disease that lowers your body's ability to fight infection (immunocompromised). ? Heart disease, including heart failure, a condition in which the arteries that lead to the heart become narrow or blocked (coronary artery disease), a disease which makes the heart muscle thick, weak, or stiff (cardiomyopathy). ? Diabetes. ? Chronic kidney disease. ? Sickle cell disease, a condition in which red blood cells have an abnormal "sickle" shape. ? Liver disease.  Are obese. What are the signs or symptoms? Symptoms of this condition can range from mild to severe. Symptoms may appear any time from 2 to 14 days after being exposed to the virus. They include:  A fever or chills.  A cough.  Difficulty breathing.  Headaches, body aches, or muscle aches.  Runny or stuffy (congested) nose.  A sore throat.  New loss of taste or smell. Some people may also have stomach problems, such as nausea, vomiting, or diarrhea. Other people may not have any symptoms of COVID-19. How is this diagnosed? This condition may be diagnosed based on:  Your signs and symptoms, especially if: ? You live in an area with a COVID-19 outbreak. ? You recently traveled to or from an area where the virus is common. ? You   provide care for or live with a person who was diagnosed with COVID-19. ? You were exposed to a person who was diagnosed with COVID-19.  A physical exam.  Lab tests, which may include: ? Taking a sample of fluid from the back of your nose and throat (nasopharyngeal fluid), your nose, or your throat using a swab. ? A sample of mucus from your lungs (sputum). ? Blood tests.  Imaging tests,  which may include, X-rays, CT scan, or ultrasound. How is this treated? At present, there is no medicine to treat COVID-19. Medicines that treat other diseases are being used on a trial basis to see if they are effective against COVID-19. Your health care provider will talk with you about ways to treat your symptoms. For most people, the infection is mild and can be managed at home with rest, fluids, and over-the-counter medicines. Treatment for a serious infection usually takes places in a hospital intensive care unit (ICU). It may include one or more of the following treatments. These treatments are given until your symptoms improve.  Receiving fluids and medicines through an IV.  Supplemental oxygen. Extra oxygen is given through a tube in the nose, a face mask, or a hood.  Positioning you to lie on your stomach (prone position). This makes it easier for oxygen to get into the lungs.  Continuous positive airway pressure (CPAP) or bi-level positive airway pressure (BPAP) machine. This treatment uses mild air pressure to keep the airways open. A tube that is connected to a motor delivers oxygen to the body.  Ventilator. This treatment moves air into and out of the lungs by using a tube that is placed in your windpipe.  Tracheostomy. This is a procedure to create a hole in the neck so that a breathing tube can be inserted.  Extracorporeal membrane oxygenation (ECMO). This procedure gives the lungs a chance to recover by taking over the functions of the heart and lungs. It supplies oxygen to the body and removes carbon dioxide. Follow these instructions at home: Lifestyle  If you are sick, stay home except to get medical care. Your health care provider will tell you how long to stay home. Call your health care provider before you go for medical care.  Rest at home as told by your health care provider.  Do not use any products that contain nicotine or tobacco, such as cigarettes,  e-cigarettes, and chewing tobacco. If you need help quitting, ask your health care provider.  Return to your normal activities as told by your health care provider. Ask your health care provider what activities are safe for you. General instructions  Take over-the-counter and prescription medicines only as told by your health care provider.  Drink enough fluid to keep your urine pale yellow.  Keep all follow-up visits as told by your health care provider. This is important. How is this prevented?  There is no vaccine to help prevent COVID-19 infection. However, there are steps you can take to protect yourself and others from this virus. To protect yourself:   Do not travel to areas where COVID-19 is a risk. The areas where COVID-19 is reported change often. To identify high-risk areas and travel restrictions, check the CDC travel website: wwwnc.cdc.gov/travel/notices  If you live in, or must travel to, an area where COVID-19 is a risk, take precautions to avoid infection. ? Stay away from people who are sick. ? Wash your hands often with soap and water for 20 seconds. If soap and water   are not available, use an alcohol-based hand sanitizer. ? Avoid touching your mouth, face, eyes, or nose. ? Avoid going out in public, follow guidance from your state and local health authorities. ? If you must go out in public, wear a cloth face covering or face mask. Make sure your mask covers your nose and mouth. ? Avoid crowded indoor spaces. Stay at least 6 feet (2 meters) away from others. ? Disinfect objects and surfaces that are frequently touched every day. This may include:  Counters and tables.  Doorknobs and light switches.  Sinks and faucets.  Electronics, such as phones, remote controls, keyboards, computers, and tablets. To protect others: If you have symptoms of COVID-19, take steps to prevent the virus from spreading to others.  If you think you have a COVID-19 infection, contact  your health care provider right away. Tell your health care team that you think you may have a COVID-19 infection.  Stay home. Leave your house only to seek medical care. Do not use public transport.  Do not travel while you are sick.  Wash your hands often with soap and water for 20 seconds. If soap and water are not available, use alcohol-based hand sanitizer.  Stay away from other members of your household. Let healthy household members care for children and pets, if possible. If you have to care for children or pets, wash your hands often and wear a mask. If possible, stay in your own room, separate from others. Use a different bathroom.  Make sure that all people in your household wash their hands well and often.  Cough or sneeze into a tissue or your sleeve or elbow. Do not cough or sneeze into your hand or into the air.  Wear a cloth face covering or face mask. Make sure your mask covers your nose and mouth. Where to find more information  Centers for Disease Control and Prevention: www.cdc.gov/coronavirus/2019-ncov/index.html  World Health Organization: www.who.int/health-topics/coronavirus Contact a health care provider if:  You live in or have traveled to an area where COVID-19 is a risk and you have symptoms of the infection.  You have had contact with someone who has COVID-19 and you have symptoms of the infection. Get help right away if:  You have trouble breathing.  You have pain or pressure in your chest.  You have confusion.  You have bluish lips and fingernails.  You have difficulty waking from sleep.  You have symptoms that get worse. These symptoms may represent a serious problem that is an emergency. Do not wait to see if the symptoms will go away. Get medical help right away. Call your local emergency services (911 in the [place] Do not drive yourself to the hospital. Let the emergency medical personnel know if you think you have  COVID-19. Summary  COVID-19 is a respiratory infection that is caused by a virus. It is also known as coronavirus disease or novel coronavirus. It can cause serious infections, such as pneumonia, acute respiratory distress syndrome, acute respiratory failure, or sepsis.  The virus that causes COVID-19 is contagious. This means that it can spread from person to person through droplets from breathing, speaking, singing, coughing, or sneezing.  You are more likely to develop a serious illness if you are 50 years of age or older, have a weak immune system, live in a nursing home, or have chronic disease.  There is no medicine to treat COVID-19. Your health care provider will talk with you about ways to treat your symptoms.    Take steps to protect yourself and others from infection. Wash your hands often and disinfect objects and surfaces that are frequently touched every day. Stay away from people who are sick and wear a mask if you are sick. This information is not intended to replace advice given to you by your health care provider. Make sure you discuss any questions you have with your health care provider. Document Revised: 06/07/2019 Document Reviewed: 09/13/2018 Elsevier Patient Education  2020 [employer] Home Oxygen Use, Adult When a medical condition keeps you from getting enough oxygen, your health care provider may instruct you to take extra oxygen at home. Your health care provider will let you know:  When to take oxygen.  For how long to take oxygen.  How quickly oxygen should be delivered (flow rate), in liters per minute (LPM or L/M). Home oxygen can be given through:  A mask.  A nasal cannula. This is a device or tube that goes in the nostrils.  A transtracheal catheter. This is a small, flexible tube placed in the trachea.  A tracheostomy. This is a surgically made opening in the trachea. These devices are connected with tubing to an oxygen source, such as:  A tank.  Tanks hold oxygen in gas form. They must be replaced when the oxygen is used up.  A liquid oxygen device. This holds oxygen in liquid form. It must be replaced when the oxygen is used up.  An oxygen concentrator machine. This filters oxygen in the room. It uses electricity, so you must have a backup cylinder of oxygen in case the power goes out. Supplies needed: To use oxygen, you will need:  A mask, nasal cannula, transtracheal catheter, or tracheostomy.  An oxygen tank, a liquid oxygen device, or an oxygen concentrator.  The tape that your health care provider recommends (optional). If you use a transtracheal catheter and your prescribed flow rate is 1 LPM or greater, you will also need a humidifier. Risks and complications  Fire. This can happen if the oxygen is exposed to a heat source, flame, or spark.  Injury to skin. This can happen if liquid oxygen touches your skin.  Organ damage. This can happen if you get too little oxygen. How to use oxygen Your health care provider or a representative from your medical device company will show you how to use your oxygen device. Follow her or his instructions. The instructions may look something like this: 1. Wash your hands. 2. If you use an oxygen concentrator, make sure it is plugged in. 3. Place one end of the tube into the port on the tank, device, or machine. 4. Place the mask over your nose and mouth. Or, place the nasal cannula and secure it with tape if instructed. If you use a tracheostomy or transtracheal catheter, connect it to the oxygen source as directed. 5. Make sure the liter-flow setting on the machine is at the level prescribed by your health care provider. 6. Turn on the machine or adjust the knob on the tank or device to the correct liter-flow setting. 7. When you are done, turn off and unplug the machine, or turn the knob to OFF. How to clean and care for the oxygen supplies Nasal cannula  Clean it with a warm, wet  cloth daily or as needed.  Wash it with a liquid soap once a week.  Rinse it thoroughly once or twice a week.  Replace it every 2-4 weeks.  If you have an infection, such  as a cold or pneumonia, change the cannula when you get better. Mask  Replace it every 2-4 weeks.  If you have an infection, such as a cold or pneumonia, change the mask when you get better. Humidifier bottle  Wash the bottle between each refill: ? Wash it with soap and warm water. ? Rinse it thoroughly. ? Disinfect it and its top. ? Air-dry it.  Make sure it is dry before you refill it. Oxygen concentrator  Clean the air filter at least twice a week according to directions from your home medical equipment and service company.  Wipe down the cabinet every day. To do this: ? Unplug the unit. ? Wipe down the cabinet with a damp cloth. ? Dry the cabinet. Other equipment  Change any extra tubing every 1-3 months.  Follow instructions from your health care provider about taking care of any other equipment. Safety tips Fire safety tips   Keep your oxygen and oxygen supplies at least 5 ft away from sources of heat, flames, and sparks at all times.  Do not allow smoking near your oxygen. Put up "no smoking" signs in your home. Avoid smoking areas when in public.  Do not use materials that can burn (are flammable) while you use oxygen.  When you go to a restaurant with portable oxygen, ask to be seated in the nonsmoking section.  Keep a Government social research officer close by. Let your fire department know that you have oxygen in your home.  Test your home smoke detectors regularly. Traveling  Secure your oxygen tank in the vehicle so that it does not move around. Follow instructions from your medical device company about how to safely secure your tank.  Make sure you have enough oxygen for the amount of time you will be away from home.  If you are planning air travel, contact the airline to find out if they allow  the use of an approved portable oxygen concentrator. You may also need documents from your health care provider and medical device company before you travel. General safety tips  If you use an oxygen cylinder, make sure it is in a stand or secured to an object that will not move (fixed object).  If you use liquid oxygen, make sure its container is kept upright.  If you use an oxygen concentrator: ? Catering manager company. Make sure you are given priority service in the event that your power goes out. ? Avoid using extension cords, if possible. Follow these instructions at home:  Use oxygen only as told by your health care provider.  Do not use alcohol or other drugs that make you relax (sedating drugs) unless instructed. They can slow down your breathing rate and make it hard to get in enough oxygen.  Know how and when to order a refill of oxygen.  Always keep a spare tank of oxygen. Plan ahead for holidays when you may not be able to get a prescription filled.  Use water-based lubricants on your lips or nostrils. Do not use oil-based products like petroleum jelly.  To prevent skin irritation on your cheeks or behind your ears, tuck some gauze under the tubing. Contact a health care provider if:  You get headaches often.  You have shortness of breath.  You have a lasting cough.  You have anxiety.  You are sleepy all the time.  You develop an illness that affects your breathing.  You cannot exercise at your regular level.  You are restless.  You  have difficult or irregular breathing, and it is getting worse.  You have a fever.  You have persistent redness under your nose. Get help right away if:  You are confused.  You have blue lips or fingernails.  You are struggling to breathe. Summary  Your health care provider or a representative from your medical device company will show you how to use your oxygen device. Follow her or his instructions.  If you use an  oxygen concentrator, make sure it is plugged in.  Make sure the liter-flow setting on the machine is at the level prescribed by your health care provider.  Keep your oxygen and oxygen supplies at least 5 ft away from sources of heat, flames, and sparks at all times. This information is not intended to replace advice given to you by your health care provider. Make sure you discuss any questions you have with your health care provider. Document Revised: 01/25/2018 Document Reviewed: 03/01/2016 Elsevier Patient Education  2020 [employer].

## 2020-04-17 NOTE — TOC Progression Note (Addendum)
Transition of Care [facility]) - Progression Note    Patient Details  Name: Kylie Petersen MRN: [id number] Date of Birth: 1996-03-23  [facility] CM/SW Contact  Darleene Cleaver, [place] Phone Number: 04/17/2020, 12:30 PM  Clinical Narrative:     04-17-20 12:30pm   CSW received call back from Virgil [phone] at [facility].  She was following up on the referral that was given yesterday.  CSW explained the situation that patient is in, and she said she will follow up with the patient and her mom to see if they can assist her.   Expected Discharge Plan: Home/Self Care Barriers to Discharge: Homeless with medical needs  Expected Discharge Plan and Services Expected Discharge Plan: Home/Self Care In-house Referral: Clinical Social Work     Living arrangements for the past 2 months: Single Family Home Expected Discharge Date: 04/16/20                 DME Agency: [employer] Date DME Agency Contacted: 04/16/20 Time DME Agency Contacted: [phone] Representative spoke with at DME Agency: Ian Malkin             Social Determinants of Health (SDOH) Interventions    Readmission Risk Interventions No flowsheet data found.

## 2020-04-23 ENCOUNTER — Telehealth (HOSPITAL_BASED_OUTPATIENT_CLINIC_OR_DEPARTMENT_OTHER): Payer: PRIVATE HEALTH INSURANCE | Admitting: Physician Assistant

## 2020-04-23 DIAGNOSIS — Z09 Encounter for follow-up examination after completed treatment for conditions other than malignant neoplasm: Secondary | ICD-10-CM | POA: Diagnosis not present

## 2020-04-23 DIAGNOSIS — F419 Anxiety disorder, unspecified: Secondary | ICD-10-CM | POA: Diagnosis not present

## 2020-04-23 DIAGNOSIS — J9601 Acute respiratory failure with hypoxia: Secondary | ICD-10-CM

## 2020-04-23 DIAGNOSIS — R739 Hyperglycemia, unspecified: Secondary | ICD-10-CM | POA: Diagnosis not present

## 2020-04-23 DIAGNOSIS — U071 COVID-19: Secondary | ICD-10-CM | POA: Diagnosis not present

## 2020-04-23 DIAGNOSIS — T380X5A Adverse effect of glucocorticoids and synthetic analogues, initial encounter: Secondary | ICD-10-CM

## 2020-04-23 MED ORDER — BUSPIRONE HCL 10 MG PO TABS
10.0000 mg | ORAL_TABLET | Freq: Two times a day (BID) | ORAL | 3 refills | Status: AC
Start: 2020-04-23 — End: ?

## 2020-04-23 NOTE — Progress Notes (Signed)
Patient ID: Kylie Petersen, female   DOB: 1996-01-28, 24 y.o.   MRN: [id number]   Virtual Visit via Video Note  I connected with Kylie Petersen on 04/23/20 at 10:50 AM EDT by a video enabled telemedicine application and verified that I am speaking with the correct person using two identifiers.   I discussed the limitations of evaluation and management by telemedicine and the availability of in person appointments. The patient expressed understanding and agreed to proceed.  PATIENT visit by video/mychart virtually in the context of Covid-19 pandemic. Patient location:  home My Location:  [facility] office Persons on the video:  Me and the patient    History of Present Illness: After hospital admission with Covid 19 and respiratory failure 8/21-8/26/2021.  She is doing well.  Still on home O2.  Denies SOB currently.  Cough resolved.  No fever.  Taste and smell are returning.  Appetite is good.  She is drinking a lot of water.  No N/V/D. Still on prednisone and they gave her an inhaler but she isn't having to use it much.  Continues to feel better daily.    Feels as though she has baseline anxiety and would like to try medication for it.  They gave her "something" in the hospital that helped.  Denies SI/HI.    From discharge summary:  Brief/Interim Summary: Kylie Petersen a 24 y.o.femalewith no significant medical history who developed fever, headache, fatigue on 8/17, tested positive for covid-19 on 8/19 and developed progressive dyspnea with home pulse oximetry indicating low oxygen levels prompting ED visit 8/21. She was confirmed to be hypoxic with bilateral CXR infiltrates and elevated inflammatory markers (CRP 17.8). Subsequent CTA chest showed no PE on degraded imaging, but revealed diffuse consolidative and ground glass opacities. Hypoxia progressed, requiring 15L O2, so steroids are increased, baricitinib is started, and she remains hospitalized for acute hypoxic respiratory failure due to  covid-19 pneumonia.  Patient met as above with acute hypoxic respiratory failure in setting of COVID-19 pneumonia.  Over the past 5 days patient has had improving symptoms has completed her Remdesivir course and was placed on baricitinib at intake with remarkably improving CRP and inflammatory markers.  At this time patient is able to ambulate without difficulty, denies any dyspnea with exertion and only mild fatigue ongoing.  She is able to ambulate today without hypoxia on 4 L nasal cannula maintaining sats above 90%.  At this time patient is otherwise stable and agreeable for discharge home, she will continue steroid taper as discussed, will need close follow-up with PCP in the next 1 to 2 weeks to further wean off oxygen and continue to increase activity as tolerated in the next few weeks.  We discussed need for ongoing quarantine, discussed case with her mother over the phone as well.  Case management working on disposition and oxygen delivery prior to discharge.  Discharge Diagnoses:  Active Problems:   Acute hypoxemic respiratory failure due to COVID-19 (HCC)   Obesity, Class III, BMI 40-49.9 (morbid obesity) (HCC)   Anxiety   Steroid-induced hyperglycemia    Observations/Objective: NAD.  No coughing or sneezing occurs on the visit.  She appears good and is using nasal cannula O2  Assessment and Plan: 1. Acute hypoxemic respiratory failure due to COVID-19 [facility]) resolving  2. Anxiety Counseled and can try - busPIRone (BUSPAR) 10 MG tablet; Take 1 tablet (10 mg total) by mouth 2 (two) times daily.  Dispense: 60 tablet; Refill: 3  3. Hospital discharge follow-up Doing well  4. Steroid-induced hyperglycemia Will follow... I have had a lengthy discussion and provided education about insulin resistance and the intake of too much sugar/refined carbohydrates.  I have advised the patient to work at a goal of eliminating sugary drinks, candy, desserts, sweets, refined sugars, processed  foods, and white carbohydrates.  The patient expresses understanding.     Follow Up Instructions: Assign PCP in 1 month   I discussed the assessment and treatment plan with the patient. The patient was provided an opportunity to ask questions and all were answered. The patient agreed with the plan and demonstrated an understanding of the instructions.   The patient was advised to call back or seek an in-person evaluation if the symptoms worsen or if the condition fails to improve as anticipated.  I provided 14 minutes of computer face-to-face time during this encounter.   Freeman Caldron, PA-C

## 2022-04-26 IMAGING — DX DG CHEST 1V PORT
1 series · 1 of 1 positions shown · non-contrast
Comparison: None

CLINICAL DATA: COVID infection with increasing shortness of breath

EXAM:
PORTABLE CHEST 1 VIEW

[chest ap]
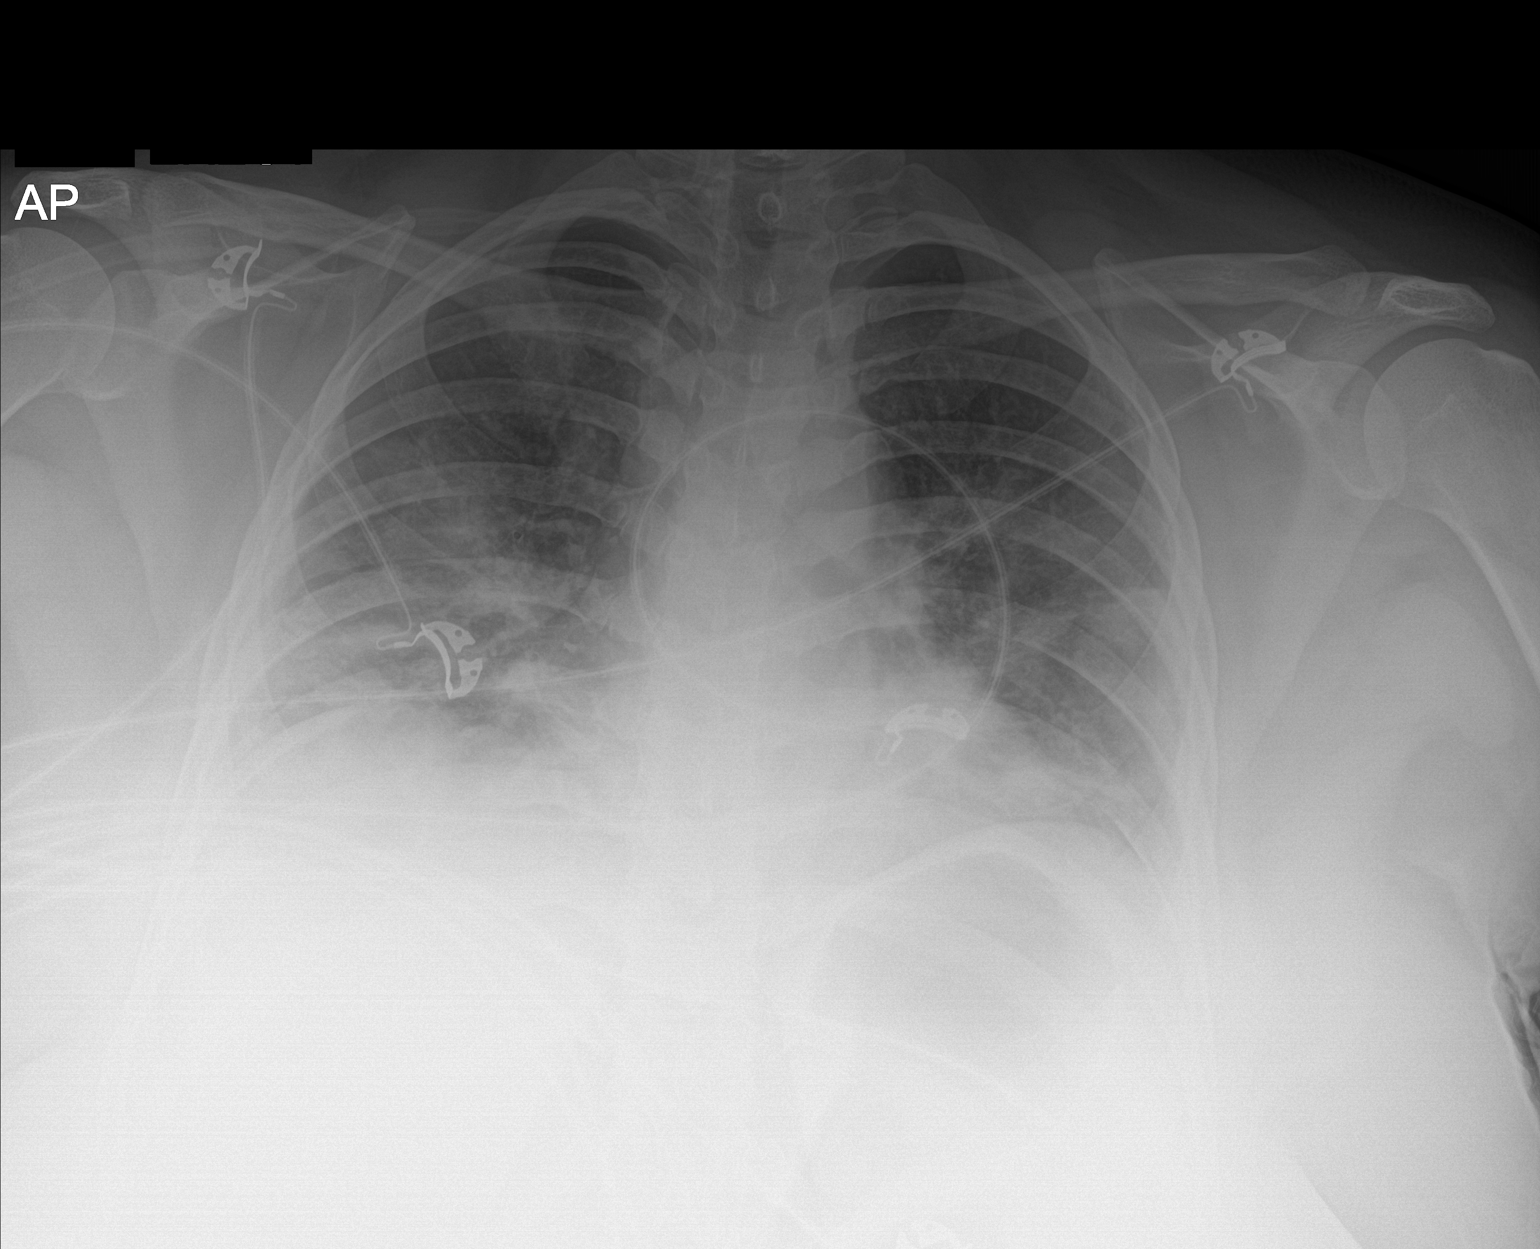

[1 of 1 positions shown; findings below may reference images not displayed]

FINDINGS: Trachea is midline. Cardiomediastinal contours partially obscured by
bilateral basilar predominant airspace disease.

Hilar structures also with partial obscuration. Lung volumes are
low.

RIGHT and LEFT hemidiaphragmatic contours are partially obscured
more so on the RIGHT than the LEFT. Airspace pattern is basilar and
peripheral predominant.

No acute skeletal process to the extent evaluated.
IMPRESSION: 1. Bilateral basilar and predominant airspace disease in this
patient with history of COVID infection. Pattern could be seen in
the setting of [id number] infection.
2. Low volume chest.

## 2022-04-26 IMAGING — CT CT ANGIO CHEST
2 of 7 series · 18 of 36 positions shown · IV contrast (Omnipaque)
Comparison: Radiograph 04/11/2020

CLINICAL DATA: UQINM-DS positive, worsening shortness of breath,
positive D-dimer and chest pain

EXAM:
CT ANGIOGRAPHY CHEST WITH CONTRAST
TECHNIQUE: Multidetector CT imaging of the chest was performed using the
standard protocol during bolus administration of intravenous
contrast. Multiplanar CT image reconstructions and MIPs were
obtained to evaluate the vascular anatomy.
CONTRAST:  100mL OMNIPAQUE IOHEXOL 350 MG/ML SOLN

[Series 7: pe coronal mpr · coronal · 0.50mm/px · 1 of 131 slices shown]
[im 66/131  mediastinal]
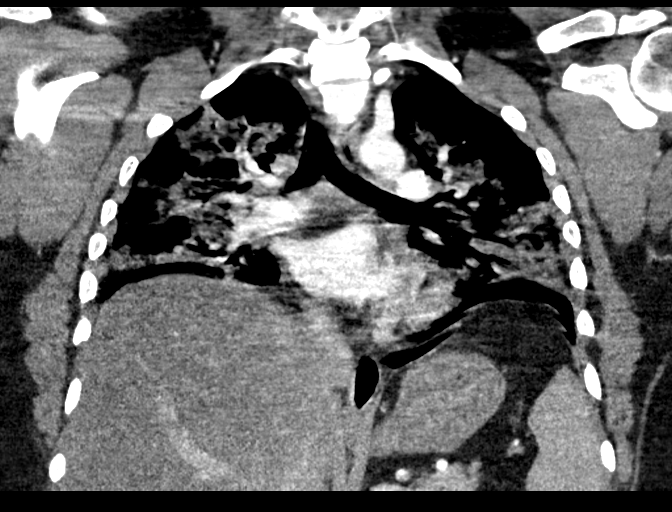

[Series 11: pe thins · axial · 0.75mm/px · z∈[-142,+76]mm · 17 of 243 slices shown]
[im 13/243  lung]
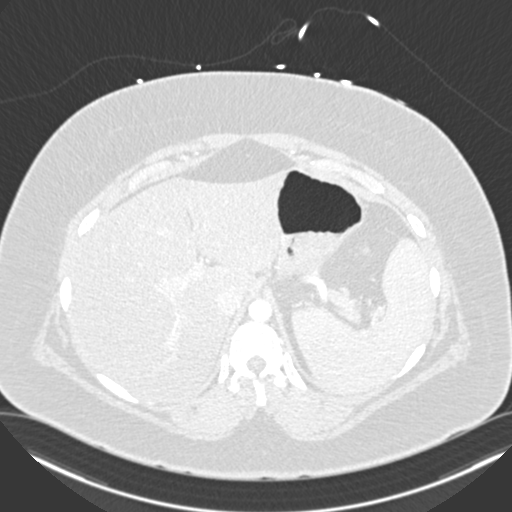
[im 25/243  mediastinal]
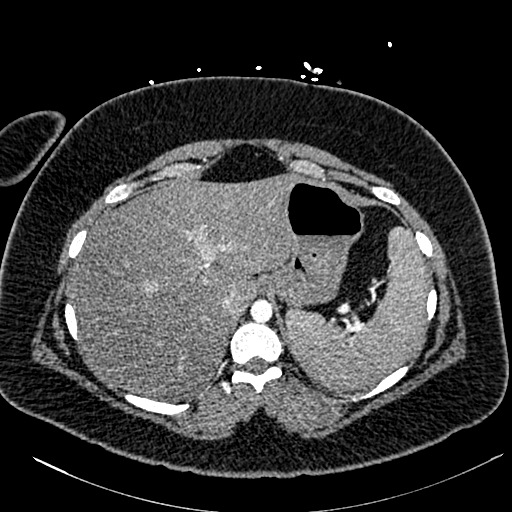
[im 37/243  lung]
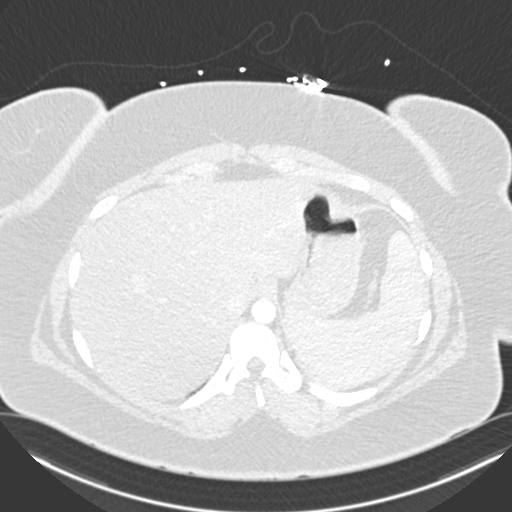
[im 49/243  mediastinal]
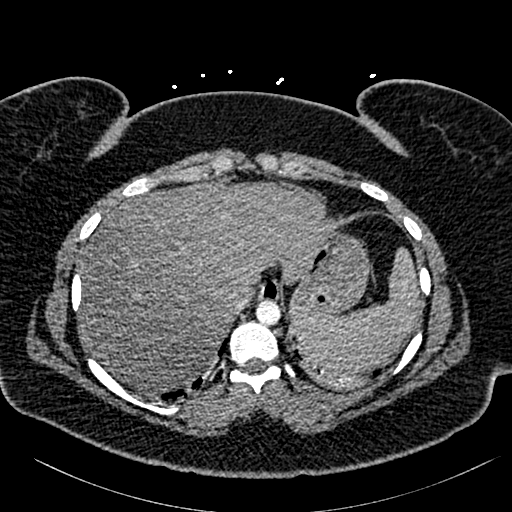
[im 73/243  lung]
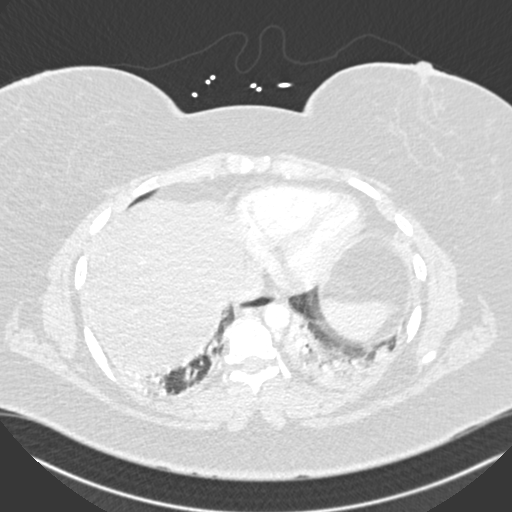
[im 85/243  mediastinal]
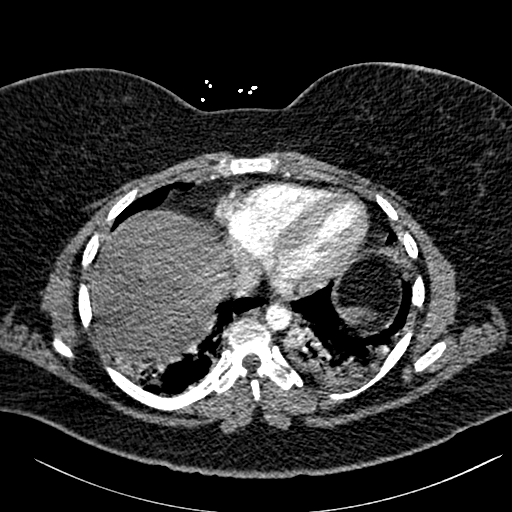
[im 97/243  lung]
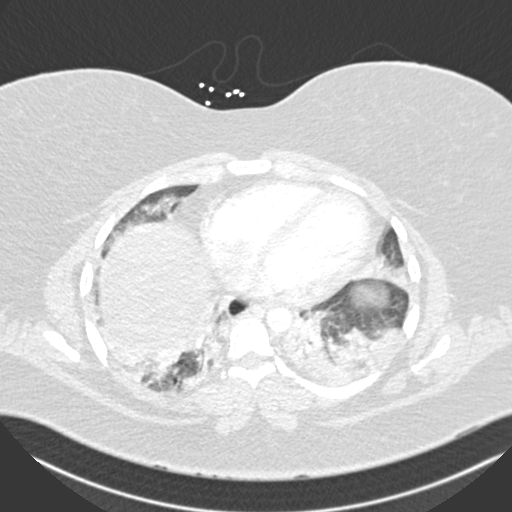
[im 109/243  mediastinal]
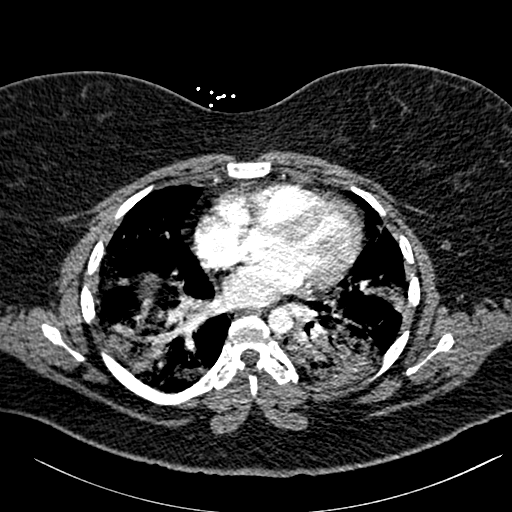
[im 122/243  lung]
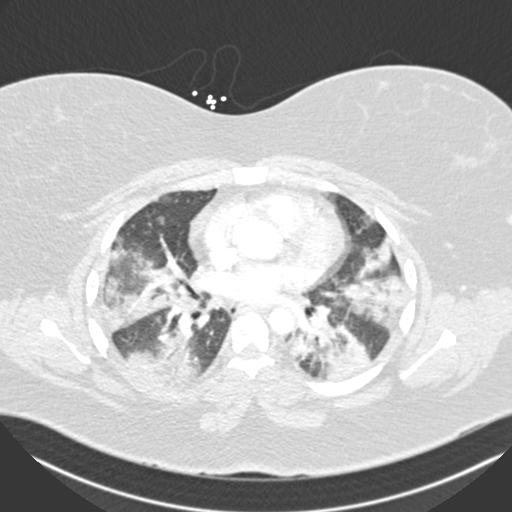
[im 134/243  mediastinal]
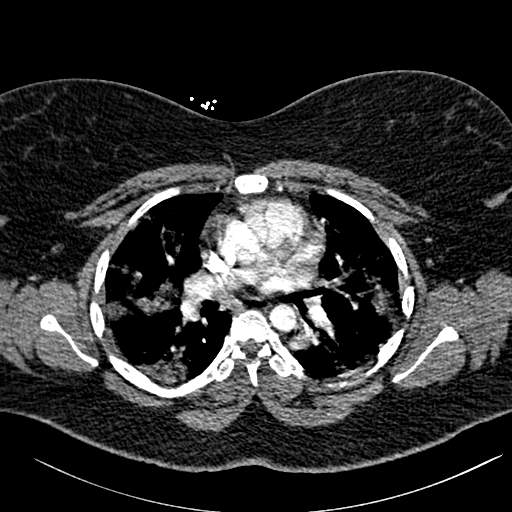
[im 146/243  lung]
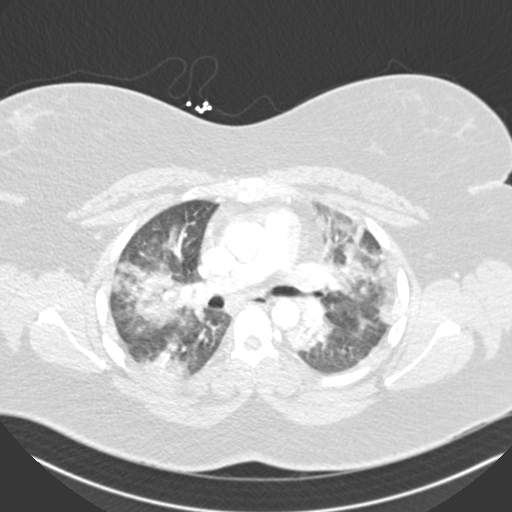
[im 158/243  mediastinal]
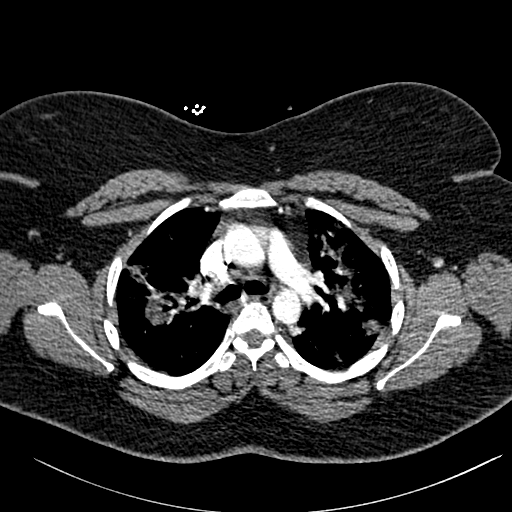
[im 170/243  lung]
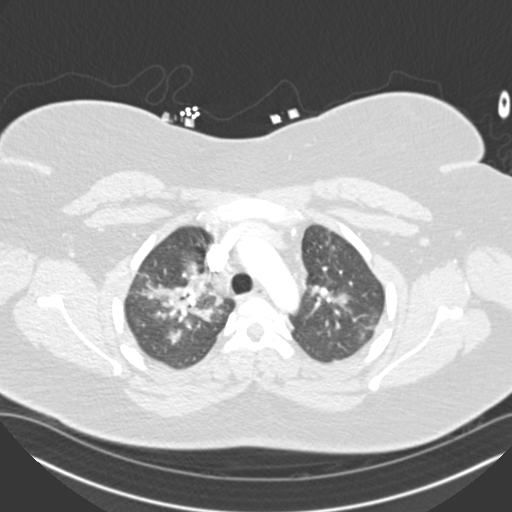
[im 194/243  mediastinal]
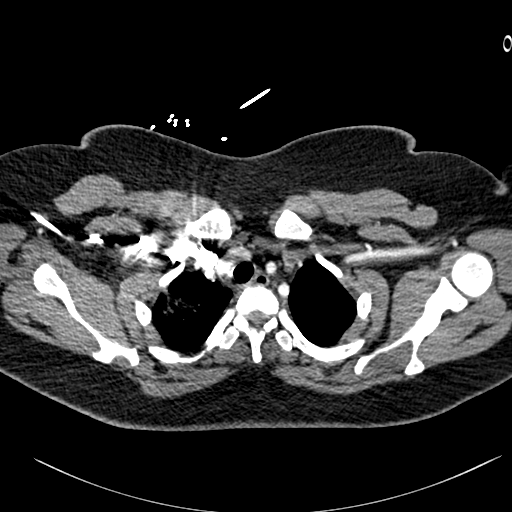
[im 206/243  lung]
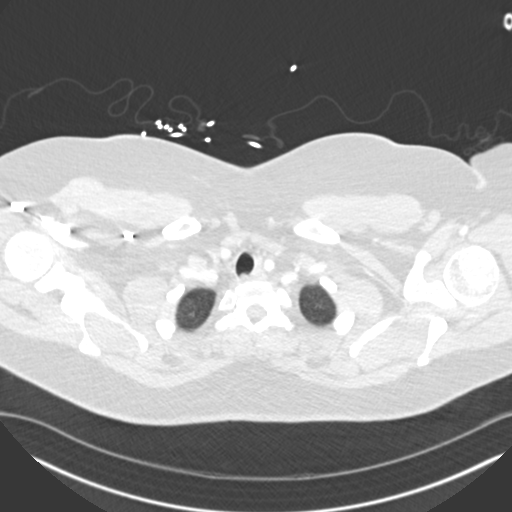
[im 218/243  mediastinal]
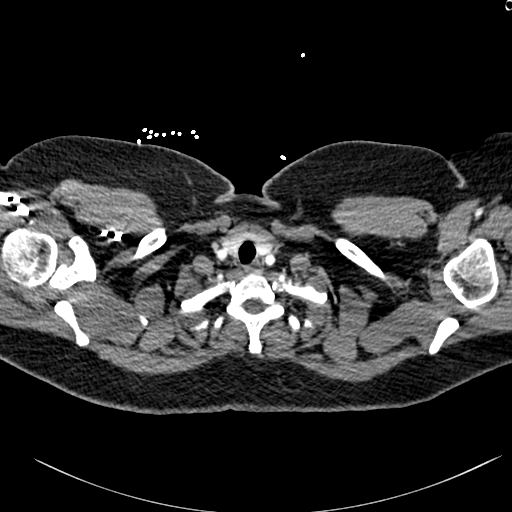
[im 230/243  lung]
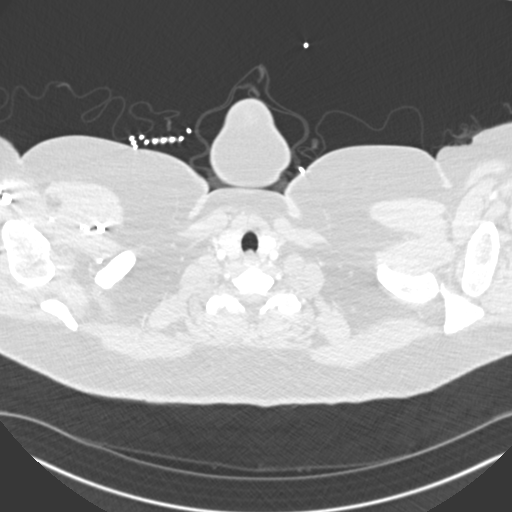

[18 of 36 positions shown; findings below may reference images not displayed]

FINDINGS: Cardiovascular: While there is satisfactory opacification of the
pulmonary arteries, evaluation is significantly limited by
respiratory motion artifact which results in artifactual attenuation
in volume averaging across the lobar and segmental pulmonary
arteries. No large central filling defects are identified. Central
pulmonary arteries are normal caliber. Insert aortic arch The aorta
is normal caliber. No acute luminal abnormality or periaortic
stranding. Normal 3 vessel branching of the aortic arch with normal
appearance of the proximal great vessels. Normal heart size. No
pericardial effusion.

Mediastinum/Nodes: Scattered low-attenuation mediastinal and hilar
nodes including a borderline enlarged 10 mm AP window lymph node
([DATE]) are favored to be reactive. No other enlarged or concerning
adenopathy in the mediastinum, hila or axillary chains. No acute
abnormality of the trachea or esophagus. Thyroid gland and thoracic
inlet are unremarkable.

Lungs/Pleura: There are multifocal areas of mixed consolidative and
ground-glass opacity throughout both lungs. No visible pneumothorax
or effusion. Evaluation for underlying pulmonary nodularity and
masses limited by parenchymal disease and motion artifact.

Upper Abdomen: Diffuse hepatic hypoattenuation compatible with
hepatic steatosis. No acute abnormalities present in the visualized
portions of the upper abdomen.

Musculoskeletal: No chest wall mass or suspicious bone lesions
identified.

Review of the MIP images confirms the above findings.
IMPRESSION: 1. No large central pulmonary emboli. Evaluation of the lobar and
more distal segmental and subsegmental branches is severely limited
by extensive respiratory motion artifact throughout the lungs.
2. Multifocal areas of mixed consolidative and ground-glass opacity
throughout both lungs, compatible with multifocal pneumonia in the
setting of UQINM-DS positivity.
3. Hepatic steatosis
